# Patient Record
Sex: Female | Born: 1980
Health system: Southern US, Community
[De-identification: ages and names within clinical notes are randomized; demographics above are authoritative.]

## PROBLEM LIST (undated history)

## (undated) DIAGNOSIS — I10 Essential (primary) hypertension: Secondary | ICD-10-CM

## (undated) DIAGNOSIS — E119 Type 2 diabetes mellitus without complications: Secondary | ICD-10-CM

---
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
## 2003-02-09 ENCOUNTER — Ambulatory Visit (HOSPITAL_COMMUNITY): Admission: RE | Admit: 2003-02-09 | Discharge: 2003-02-09 | Payer: Self-pay | Admitting: *Deleted

## 2003-03-08 ENCOUNTER — Ambulatory Visit (HOSPITAL_COMMUNITY): Admission: RE | Admit: 2003-03-08 | Discharge: 2003-03-08 | Payer: Self-pay | Admitting: *Deleted

## 2003-05-17 ENCOUNTER — Ambulatory Visit (HOSPITAL_COMMUNITY): Admission: RE | Admit: 2003-05-17 | Discharge: 2003-05-17 | Payer: Self-pay | Admitting: *Deleted

## 2003-06-26 ENCOUNTER — Encounter (INDEPENDENT_AMBULATORY_CARE_PROVIDER_SITE_OTHER): Payer: Self-pay | Admitting: Specialist

## 2003-06-26 ENCOUNTER — Ambulatory Visit: Payer: Self-pay | Admitting: Obstetrics & Gynecology

## 2003-06-26 ENCOUNTER — Inpatient Hospital Stay (HOSPITAL_COMMUNITY): Admission: AD | Admit: 2003-06-26 | Discharge: 2003-06-29 | Payer: Self-pay | Admitting: Obstetrics and Gynecology

## 2003-07-04 ENCOUNTER — Inpatient Hospital Stay (HOSPITAL_COMMUNITY): Admission: RE | Admit: 2003-07-04 | Discharge: 2003-07-04 | Payer: Self-pay | Admitting: *Deleted

## 2006-02-23 IMAGING — US US OB FOLLOW-UP
1 series · 13 of 28 positions shown · non-contrast
Comparison: none

CLINICAL DATA: G2P1.  Assess growth.  EDC 07/29/03 by first ultrasound (29 weeks 4 days).

[Series 1: unknown · 0.30mm/px · 13 of 30 slices shown]
[im 2/30]
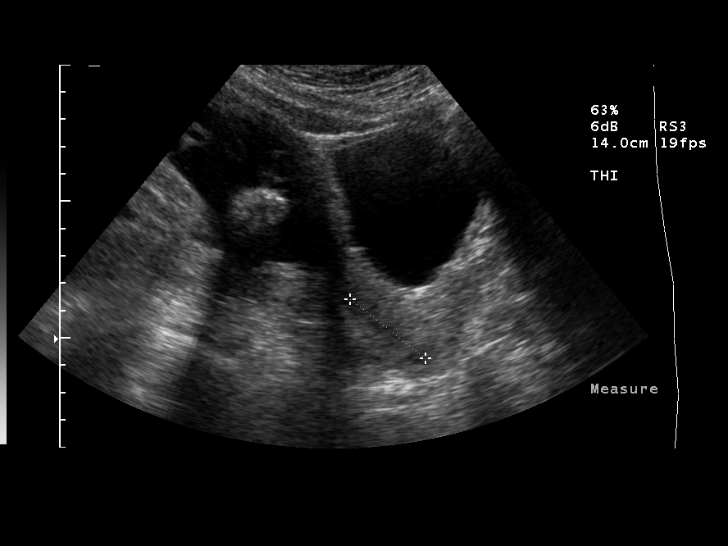
[im 4/30]
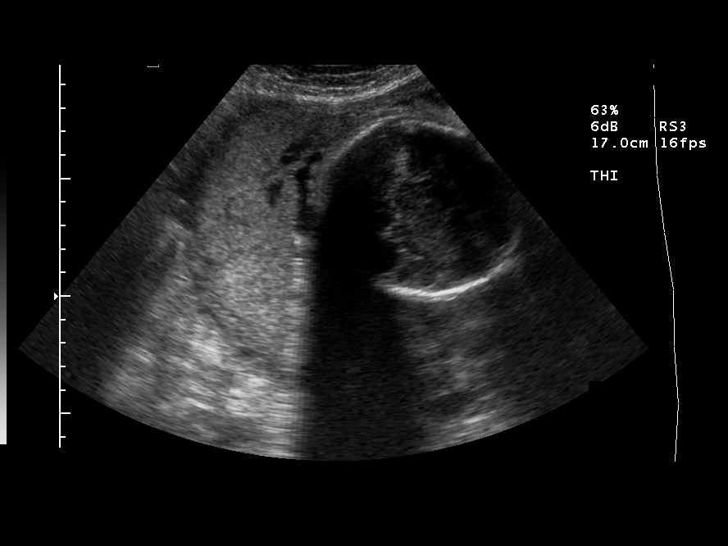
[im 6/30]
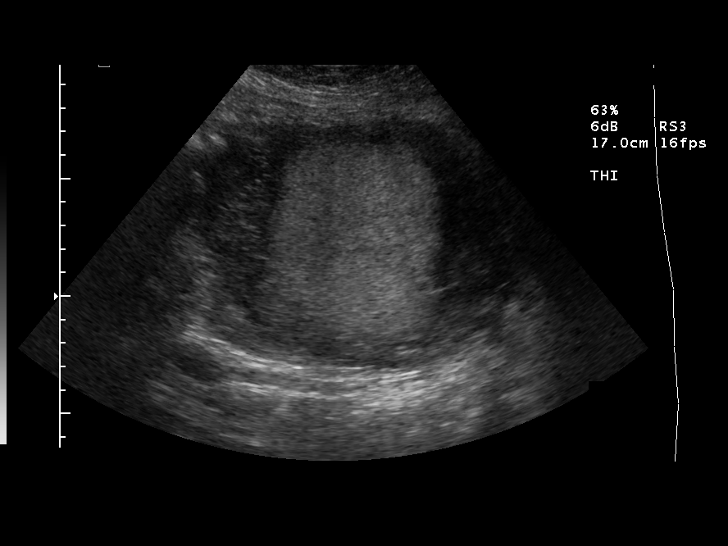
[im 8/30]
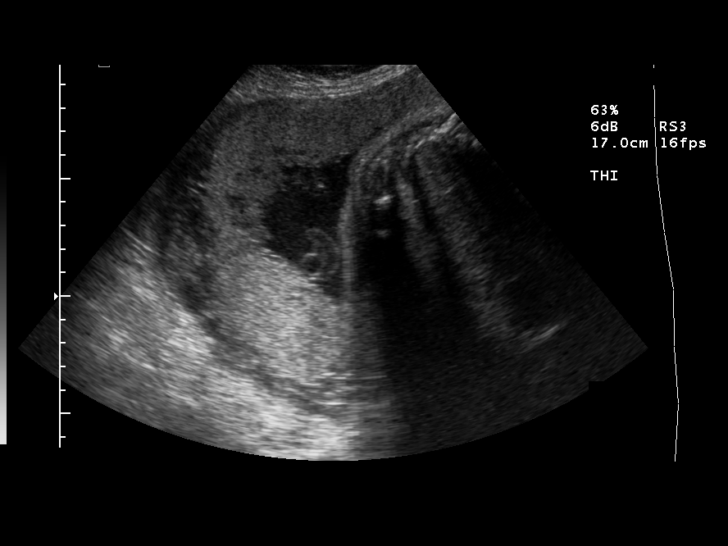
[im 10/30]
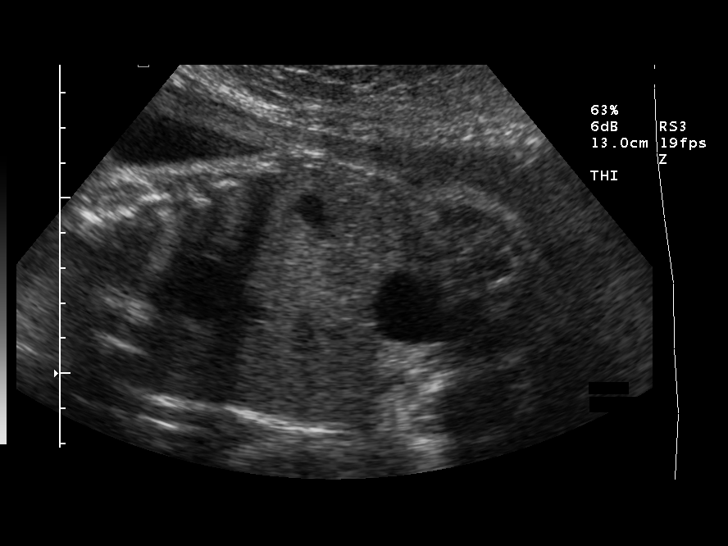
[im 12/30]
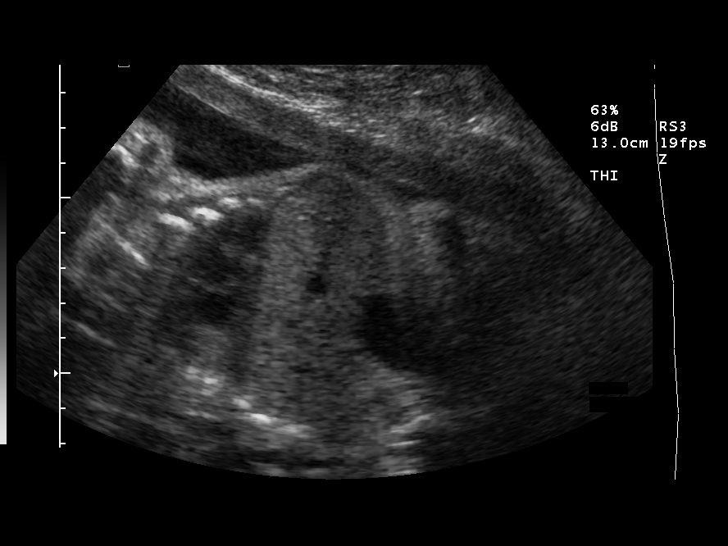
[im 16/30]
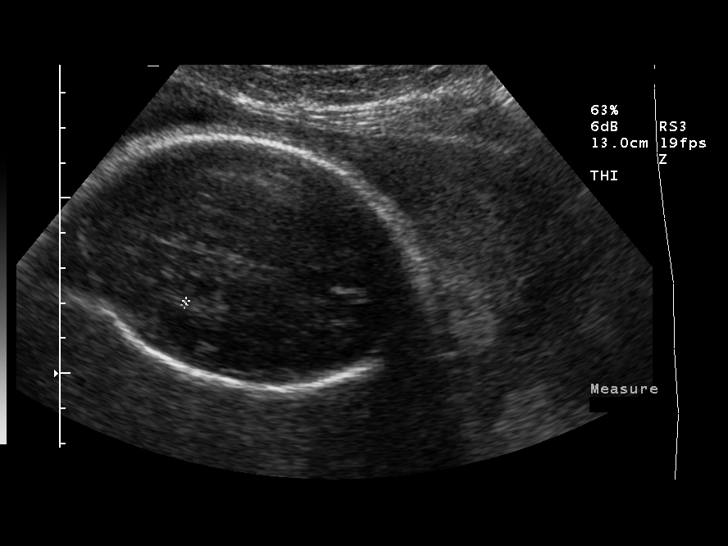
[im 18/30]
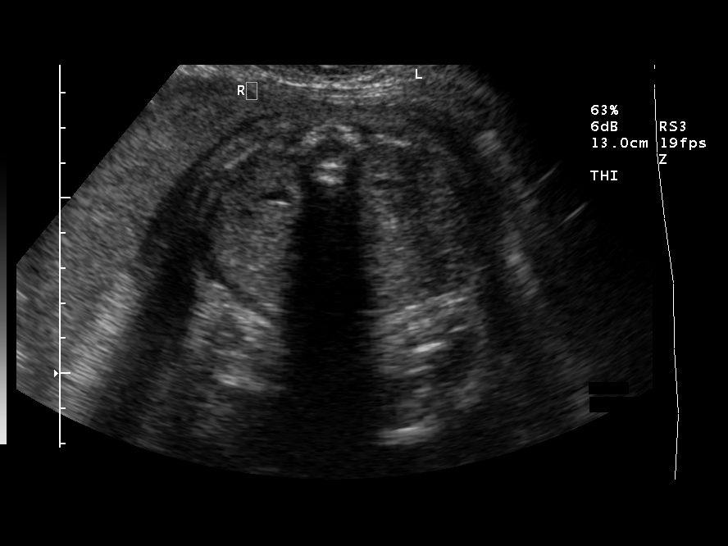
[im 20/30]
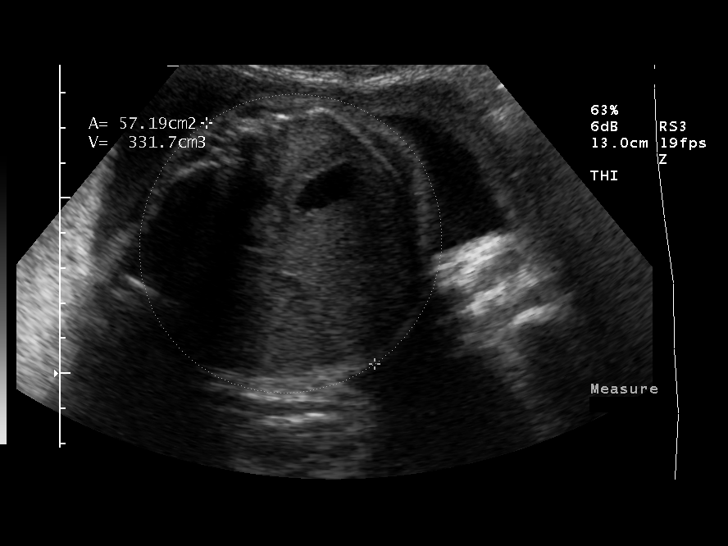
[im 22/30]
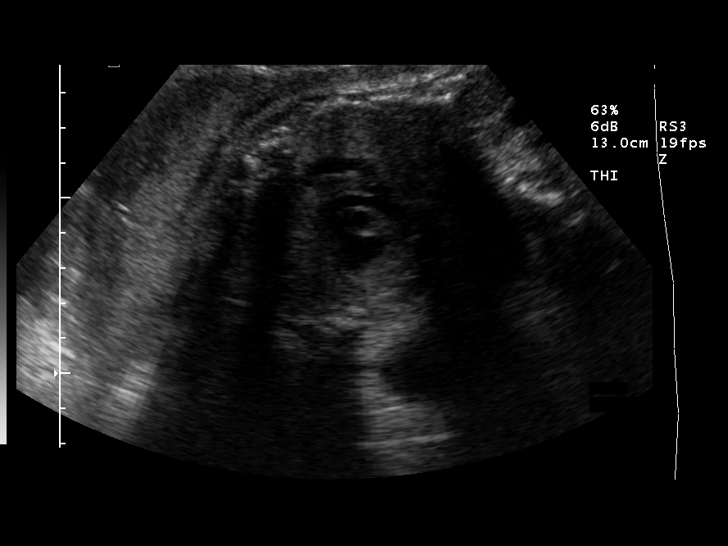
[im 24/30]
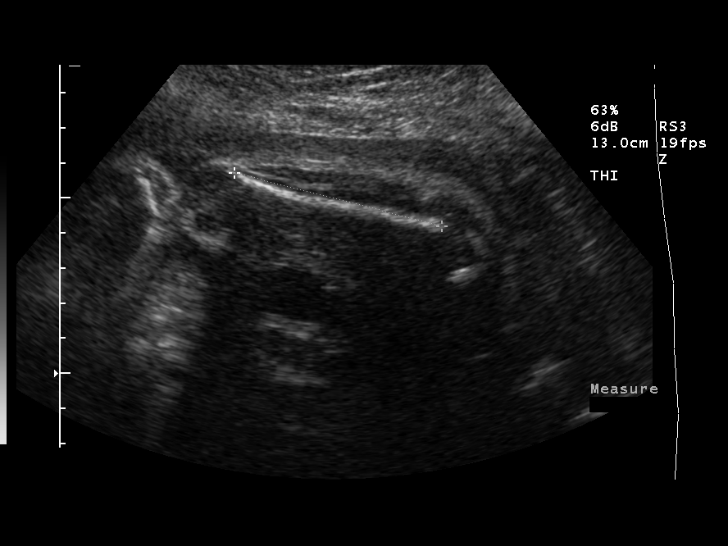
[im 26/30]
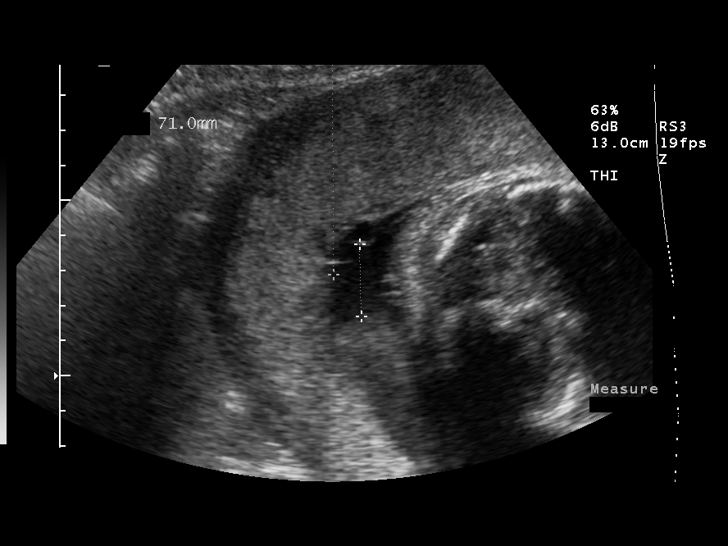
[im 28/30]
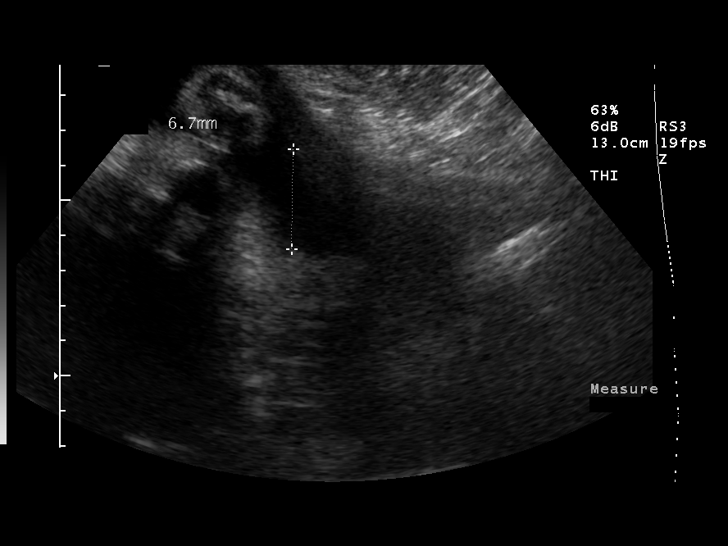

[13 of 28 positions shown; findings below may reference images not displayed]

OBSTETRICAL ULTRASOUND RE-EVALUATION
 Number of Fetuses: 1
 Heart Rate: 133 BPM
 Movement: Yes
 Breathing: No
 Presentation: Breech
 Placental Location: Fundal, posterior, right lateral
 Grade: I
 Previa: No
 Amniotic Fluid (subjective): Normal
 Amniotic Fluid (objective): 13.3 cm AFI (1th-11th %ile = 9.0-23.4 cm for 30 weeks)

 FETAL BIOMETRY
 BPD:  7.1 cm, 28W 3D
 HC: 27.0 cm, 29W 3D
 AC: 27.0 cm, 31W 1D
 FL: 6.1 cm, 31W 4D

 Mean GA: 30W 1D
 Assigned GA: 29W 4D

 EFW: 2526 g (H) 43th-11th %ile (1311-1862 g) for 30 weeks

 FETAL ANATOMY
 Lateral Ventricles: Visualized 
 Thalami/CSP: Visualized 
 Posterior Fossa: Previously seen 
 Nuchal Region: Previously seen 
 Spine: Previously seen 
 4 Chamber Heart on Left: Previously seen 
 Stomach on Left: Visualized 
 3 Vessel Cord: Previously seen 
 Cord Insertion Site: Previously seen 
 Kidneys: Visualized 
 Bladder: Visualized 
 Extremities: Previously seen 

 ADDITIONAL ANATOMY VISUALIZED: RVOT, diaphragm.

 Evaluation limited by: fetal positioning.

 MATERNAL FINDINGS
 Cervix: 3.5 transabdominally
IMPRESSION: Single living intrauterine fetus in breech presentation.  Patient’s assigned gestational age is 29 weeks 4 days by first ultrasound.  She measures 30 weeks 1 day today with a fetal weight of 2526 grams, falling between the 75th and 90th percentile for 30 weeks.  Growth is appropriate.
 Normal amniotic fluid volume with AFI 13.3. cm.

## 2007-01-20 ENCOUNTER — Emergency Department (HOSPITAL_COMMUNITY): Admission: EM | Admit: 2007-01-20 | Discharge: 2007-01-20 | Payer: Self-pay | Admitting: Emergency Medicine

## 2010-06-28 NOTE — Discharge Summary (Signed)
NAME:  Alyssa Price, Alyssa Price                    ACCOUNT NO.:  [id number]   MEDICAL RECORD NO.:  [id number]                   PATIENT TYPE:  INP   LOCATION:  9147                                 FACILITY:  [facility]   PHYSICIAN:  Phil D. Okey Dupre, M.D.                  DATE OF BIRTH:  1980-10-25   DATE OF ADMISSION:  06/26/2003  DATE OF DISCHARGE:                                 DISCHARGE SUMMARY   DISCHARGE DIAGNOSES:  1. Preterm delivery infant.  2. Maternal fever.   DISCHARGE MEDICATIONS:  1. Ibuprofen 600 mg one p.o. q.6h. p.r.n. pain.  2. Percocet one p.o. q.4-6h. p.r.n. pain.  3. Prenatal vitamins one p.o. daily x6 weeks.   DISCHARGE INSTRUCTIONS:  Wound care per instruction booklet.  Return to the MAU at [facility] Tuesday, Jul 04, 2003 for staple  removal.   DIET AT HOME:  Regular.   ACTIVITY:  Per booklet and no heavy lifting for 6 weeks.   SEXUAL ACTIVITY:  Per booklet and nothing in vagina for 6 weeks.   FOLLOW-UP:  Return to [facility] in 6 weeks.   SPECIAL INSTRUCTIONS:  Continue incentive spirometry.   DISCHARGE LABORATORY DATA:  Hemoglobin 8.9, hematocrit 27.7.  She had a  urinalysis that showed 15 for ketones but was otherwise within normal  limits.   PROCEDURES:  Low transverse cesarean section and bilateral tubal ligation.   HISTORY OF PRESENT ILLNESS:  This is a 30 year old G2 P1-0-0-1 now P1-1-0-2  admitted at 78 and 2 weeks with spontaneous rupture of membranes.  She  initially had a low-grade temperature and was treated with ampicillin and  gentamycin given her ruptured membranes.  She underwent a repeat low  transverse cesarean section after a failed VBAC.  She also underwent a BTL  at the time of her section.  She delivered a female infant with Apgars of 8  at one minute and 9 at five minutes.  She is bottle feeding.  She delivered  a three-vessel-cord placenta that was manually delivered and the placenta  was intact.   PRENATAL LABORATORY DATA:   The patient had B positive blood, antibody  negative.  Hemoglobin 12, platelets 305.  She was rubella immune.  Hepatitis  B surface antigen was negative.  RPR nonreactive.  HIV nonreactive.  GC and  chlamydia negative.  GBS negative.   HOSPITAL COURSE:  The patient labored for greater than 12 hours with  ruptured membranes and was unable to actively push the infant out.  Therefore she was taken to cesarean section with repeat low transverse  cesarean section and bilateral tubal ligation.  During her hospitalization  she was mildly febrile and underwent antibiotic treatment.  She continued to  have occasional low-grade temperatures, the highest of which was 99.9.  She  underwent testing for white blood count and urinalysis.  Her white count  came down from 13.5 to 11.1 and her urinalysis did not show  any signs of  infection.  It was felt that this was likely secondary to atelectasis as the  patient was clearly not taking deep breaths.  She was given incentive  spirometry and this should be continued as an outpatient.  The patient was  also tachycardic, also felt to be secondary to her atelectasis.  At  discharge her heart rate ranged between 100 and 110.  She was discharged to  home in improved condition with the previously-noted follow-up.     Ursula Beath, MD                     Phil D. Okey Dupre, M.D.    JT/MEDQ  D:  06/29/2003  T:  06/29/2003  Job:  [id number]

## 2010-06-28 NOTE — Op Note (Signed)
[facility]  Patient:    KADEISHA, BETSCH                   MRN: [id number] Proc. Date: 08/06/00 Adm. Date:  [id number] Attending:  Tammi Sou                           Operative Report  PREOPERATIVE DIAGNOSES:       1. Intrauterine pregnancy at 39 weeks.                               2. Gestational diabetes mellitus, diet                                  controlled.                               3. Meconium-stained fluid.                               4. Arrest of descent.                               5. Suspicious fetal heart tracing with                                  repetitive moderate to severe variable                                  decelerations.                               6. Persistent occiput posterior presentation.                               7. Chorioamnionitis.  POSTOPERATIVE DIAGNOSES:      1. Intrauterine pregnancy at 39 weeks.                               2. Gestational diabetes mellitus, diet                                  controlled.                               3. Meconium-stained fluid.                               4. Arrest of descent.                               5. Suspicious fetal heart tracing with  repetitive moderate to severe variable                                  decelerations.                               6. Persistent occiput posterior presentation.  OPERATION:                    Primary low transverse cesarean section via                               Pfannenstiel.  SURGEON:                      Roseanna Rainbow, M.D.  ASSISTANT:                    Gwenlyn Perking, M.D.  ANESTHESIA:                   Epidural.  COMPLICATIONS:                None.  ESTIMATED BLOOD LOSS:         800 cc.  FLUIDS AS PER ANESTHESIOLOGY: Urine output 100 cc blood-tinged urine noted both at the beginning and at the end of the procedure.  INDICATIONS:                   The patient is an 30 year old, G1, P0, at 39+ weeks who presented in active labor.  She progressed to fully dilated; however, the past was malpositioned with persistent occiput posterior.  There was failure to descend below the +1 to +2 stations.  FINDINGS:                     Female infant, cephalic presentation.  Thick meconium noted with meconium below the cord.  Neonatology present at delivery. Apgars 4/8, weight 6 pounds, 14 ounces.  Umbilical vein pH 7.33. Normal uterus, tubes, and ovaries.  DESCRIPTION OF PROCEDURE:     The patient was taken to the operating room where her epidural anesthetic was found to be adequate.  She was then prepped and draped in the usual sterile fashion in the dorsosupine position with a leftward tilt.  A Pfannenstiel skin incision was then made with the scalpel and carried through to the underlying layer of fascia.  The fascia was nicked in the midline and the incision extended laterally with Mayo scissors.  The superior aspect of the fascial incision was then grasped with Kocher clamps, elevated, and the underlying rectus muscles dissected off.  Attention was then turned to the inferior aspect of the incision which was manipulated in a similar fashion.  The rectus muscles were separated in the midline, and the parietal peritoneum identified, tented up, and entered sharply.  The peritoneal incision was then extended superiorly and then inferiorly with good visualization of the bladder.   The bladder blade was then inserted and the vesicle uterine peritoneum identified, grasped with pickups, and entered sharply with Metzenbaum scissors.  This incision was then extended laterally and the bladder flap created digitally.  The bladder blade was then reinserted and the lower uterine segment incised in transverse fashion with the scalpel. The uterine incision was then extended laterally with bandage  scissors.  The bladder blade was removed, and, with some  difficulty and with assistance with elevation of the head from the below, the infants head was delivered atraumatically.  The nose and mouth were suctioned with the DeLee suction. The cord clamped and cut.  The infant was then handed off to the awaiting neonatologist.  Cord gases were sent.  The placenta was then removed.  The uterus exteriorized and cleared of all clots of debris.  Upon inspection of the uterine incision, there were two small extensions involving the inferolateral margins of the incision bilaterally.  These were 1 to 2 cm in length.  These extensions were repaired separately with 0 Monocryl in a running locked fashion.  The remainder of the uterine incision was repaired in a similar fashion.  Excellent hemostasis was noted.  The uterus was returned to the abdomen.  The gutters were cleared of all clots.  The fascia was reapproximated with 0 Vicryl in a running fashion.  The skin was closed with staples. The patient tolerated the procedure well.  Sponge, lap, and needle counts were correct x 2.  The patient was taken to the PACU in stable condition. DD:  08/06/00 TD:  08/06/00 Job: [id number]

## 2010-06-28 NOTE — Op Note (Signed)
NAME:  Alyssa Price, Alyssa Price                    ACCOUNT NO.:  [id number]   MEDICAL RECORD NO.:  [id number]                   PATIENT TYPE:  INP   LOCATION:  9147                                 FACILITY:  [facility]   PHYSICIAN:  Lesly Dukes, M.D.              DATE OF BIRTH:  09/05/1980   DATE OF PROCEDURE:  06/27/2003  DATE OF DISCHARGE:                                 OPERATIVE REPORT   PREOPERATIVE DIAGNOSES:  30 year old, G2, para 1-0-0-1 at 35 weeks and 2  days estimated gestational age, failed VBAC and also multiparity and desires  sterility.   POSTOPERATIVE DIAGNOSES:  30 year old, G2, para 1-0-0-1 at 35 weeks and 2  days estimated gestational age, failed VBAC and also multiparity and desires  sterility.   PROCEDURE:  Repeat low flap transverse cesarean section and bilateral tubal  ligation; lysis of adhesions.   SURGEON:  Lesly Dukes, M.D.   ASSISTANT:  Randye Lobo, M.D.   ANESTHESIA:  Epidural.   ESTIMATED BLOOD LOSS:  700 mL.   COMPLICATIONS:  None.   PATHOLOGY:  Placenta.   FINDINGS:  Viable female infant with Apgar of 8 at 1 minute, 9 at 5 minutes  weighing 6 pounds, 8 ounces, vertex presentation with deflexed OP; arterial  pH equal 7.15; clear fluid, normal uterus, fallopian tubes and ovaries  bilaterally; omental adhesions, __________ delivery.   DESCRIPTION OF PROCEDURE:  After full consent was obtained, the patient was  taken to the operating room where epidural anesthesia was found to be  adequate. The patient was placed in dorsal lithotomy position with a  leftward tilt, prepped and draped in the normal sterile fashion.  A Foley  was then applied and IV was running.  A Pfannenstiel skin incision was made  with the scalpel and carried down to the underlying fascia, the fascia was  incised in the midline, incision was extended bilaterally. The superior and  inferior aspects of the fascial incision were grasped with Kocher clamps,  tented up, and  dissected off sharply and bluntly from underlying layers of  rectus muscles.  The rectus muscle was separated in the midline. The  peritoneum was entered sharply and this incision was extended both  superiorly and inferiorly with good visualization of the bladder.  The  bladder blade was inserted.  The vesicouterine peritoneum was identified,  tented up and entered sharply with Metzenbaum scissors. This incision was  extended bilaterally.  The bladder flap was created digitally.  The bladder  blade was reinserted.  The uterine incision was made in a transverse fashion  in the lower uterine segment.  This incision was extended bilaterally with  the banded scissors.  The baby's head delivered without incident, and the  nose and mouth were suctioned.  The baby's body delivered easily.  The cord  was clamped and cut, and the baby was handed off to the waiting  pediatrician. Cord blood was sent for type and screen  and cord gas was also  sent.  The placenta delivered spontaneously with three vessel cord. The  uterus was cleared of all clot and debris and exteriorized.  The uterine  incision was closed with #0 Vicryl in a running locked fashion. Good  hemostasis was noted. The gutters were cleared of all clot and debris.  The  fallopian tubes were then ligated using Filshie clips. Each fallopian tube  was tented up in the middle and clamped with the Filshie clip.  The uterus  was returned to the abdomen and noted to be hemostatic off tension.  The  Filshie clips were still in place. The fascia was then closed with #0 Vicryl  in a running fashion. Good hemostasis was noted.  The subcutaneous tissue  was copiously irrigated. The wound was also found to be hemostatic. The skin  was closed with staples. The patient tolerated the procedure well. Sponge,  lap, instrument and needle count were correct x2.  The patient went to the  recovery room in good condition.                                                Lesly Dukes, M.D.    Lora Paula  D:  06/27/2003  T:  06/27/2003  Job:  [id number]

## 2010-06-28 NOTE — Discharge Summary (Signed)
[facility]  Patient:    Alyssa Price, Alyssa Price                   MRN: [id number] Adm. Date:  [id number] Disc. Date: [id number] Attending:  Tammi Sou Dictator:   Santiago Bumpers, M.D.                           Discharge Summary  ADMISSION DIAGNOSES:          1. Term intrauterine pregnancy in active labor.                               2. Diet controlled gestational diabetes                                  mellitus.  DISCHARGE DIAGNOSES:          1. Term intrauterine pregnancy in active labor.                               2. Diet controlled gestational diabetes                                  mellitus.                               3. Status post cesarean delivery of a viable                                  female infant.                               4. Meconium stained fluid.  DISCHARGE MEDICATIONS:        1. Percocet one tab q.4-6h. p.r.n. for pain.                               2. Ibuprofen one tab q.6h. p.r.n. for pain.                               3. Prenatal vitamins one tab q.d. for six weeks.  RESIDENT:                     Gwenlyn Perking, M.D.  CONSULTS:                     None.  PROCEDURE:                    Low transverse cesarean section on August 06, 2000.  INDICATIONS:                  Arrest of descent, meconium stained fluid, persistent OP, suspicious fetal heart tracing.  FINDINGS:                     Viable female infant.  Estimated blood loss 800 cc.  No complications.  HISTORY AND  PHYSICAL:         For complete H&P please see resident H&P in chart.  Briefly, this was an 30 year old G1, P0 who presented at [redacted] weeks gestation in early active labor with cervical examination of 5 cm, 90% effaced, -1 station with vertex presentation.  She was afebrile and the remainder of physical examination was within normal limits.  Routine prenatal laboratories were unremarkable.  The patient did carry a diagnosis of gestational  diabetes mellitus which was diet controlled.  She was admitted to labor and delivery for expectant management of labor.  HOSPITAL COURSE:              Patient was admitted to labor and delivery unit and progressed in active phase of labor.  Her pushing was difficult and did not result in descent of the fetus.  Position was determined to be direct OP. Fetus did have severe variable decelerations with expulsive efforts.  Due to this nonreassuring fetal heart tracing, meconium stained fluid, and arrest of descent the patient was taken for low transverse cesarean section and findings were as dictated in the procedure portion of this dictation.  Postoperative course was complicated by a low grade fever of 100.8 postoperative day #1.  Patient was continued on Unasyn.  Physical examination did not reveal any focus of infection.  She had no further fever and physical examination remained unremarkable with incision being clean, dry, and intact and abdomen was soft and appropriately tender.  The postoperative hemoglobin was 9.9.  On postoperative day #3 patient was afebrile and was determined to be recovery well and was discharged home.  [facility]. DD:  08/27/00 TD:  08/28/00 Job: [id number]

## 2013-07-12 ENCOUNTER — Ambulatory Visit: Payer: Self-pay | Admitting: Internal Medicine

## 2013-12-26 ENCOUNTER — Encounter: Payer: Self-pay | Admitting: Internal Medicine

## 2013-12-26 ENCOUNTER — Ambulatory Visit (HOSPITAL_BASED_OUTPATIENT_CLINIC_OR_DEPARTMENT_OTHER): Payer: Medicaid Other

## 2013-12-26 ENCOUNTER — Ambulatory Visit: Payer: Medicaid Other | Attending: Internal Medicine | Admitting: Internal Medicine

## 2013-12-26 VITALS — BP 113/72 | HR 99 | Temp 98.7°F | Resp 16 | Wt 123.6 lb

## 2013-12-26 DIAGNOSIS — Z0000 Encounter for general adult medical examination without abnormal findings: Secondary | ICD-10-CM

## 2013-12-26 DIAGNOSIS — J3089 Other allergic rhinitis: Secondary | ICD-10-CM | POA: Diagnosis not present

## 2013-12-26 DIAGNOSIS — R05 Cough: Secondary | ICD-10-CM | POA: Insufficient documentation

## 2013-12-26 DIAGNOSIS — Z23 Encounter for immunization: Secondary | ICD-10-CM | POA: Diagnosis not present

## 2013-12-26 DIAGNOSIS — K219 Gastro-esophageal reflux disease without esophagitis: Secondary | ICD-10-CM | POA: Insufficient documentation

## 2013-12-26 DIAGNOSIS — Z9109 Other allergy status, other than to drugs and biological substances: Secondary | ICD-10-CM | POA: Insufficient documentation

## 2013-12-26 DIAGNOSIS — R0981 Nasal congestion: Secondary | ICD-10-CM | POA: Diagnosis not present

## 2013-12-26 DIAGNOSIS — R5383 Other fatigue: Secondary | ICD-10-CM | POA: Insufficient documentation

## 2013-12-26 DIAGNOSIS — R053 Chronic cough: Secondary | ICD-10-CM

## 2013-12-26 DIAGNOSIS — Z139 Encounter for screening, unspecified: Secondary | ICD-10-CM

## 2013-12-26 DIAGNOSIS — Z91048 Other nonmedicinal substance allergy status: Secondary | ICD-10-CM

## 2013-12-26 LAB — CBC WITH DIFFERENTIAL/PLATELET
BASOS ABS: 0 10*3/uL (ref 0.0–0.1)
Basophils Relative: 0 % (ref 0–1)
EOS PCT: 1 % (ref 0–5)
Eosinophils Absolute: 0.1 10*3/uL (ref 0.0–0.7)
HCT: 37.8 % (ref 36.0–46.0)
HEMOGLOBIN: 12.4 g/dL (ref 12.0–15.0)
LYMPHS ABS: 1.4 10*3/uL (ref 0.7–4.0)
Lymphocytes Relative: 19 % (ref 12–46)
MCH: 28.6 pg (ref 26.0–34.0)
MCHC: 32.8 g/dL (ref 30.0–36.0)
MCV: 87.3 fL (ref 78.0–100.0)
MPV: 9.6 fL (ref 9.4–12.4)
Monocytes Absolute: 0.8 10*3/uL (ref 0.1–1.0)
Monocytes Relative: 11 % (ref 3–12)
NEUTROS PCT: 69 % (ref 43–77)
Neutro Abs: 5 10*3/uL (ref 1.7–7.7)
PLATELETS: 257 10*3/uL (ref 150–400)
RBC: 4.33 MIL/uL (ref 3.87–5.11)
RDW: 13.9 % (ref 11.5–15.5)
WBC: 7.2 10*3/uL (ref 4.0–10.5)

## 2013-12-26 LAB — COMPLETE METABOLIC PANEL WITH GFR
ALT: 10 U/L (ref 0–35)
AST: 19 U/L (ref 0–37)
Albumin: 4.1 g/dL (ref 3.5–5.2)
Alkaline Phosphatase: 50 U/L (ref 39–117)
BUN: 9 mg/dL (ref 6–23)
CALCIUM: 9.5 mg/dL (ref 8.4–10.5)
CO2: 21 meq/L (ref 19–32)
Chloride: 104 mEq/L (ref 96–112)
Creat: 0.7 mg/dL (ref 0.50–1.10)
GFR, Est African American: >89 mL/min
GFR, Est Non African American: >89 mL/min
GLUCOSE: 75 mg/dL (ref 70–99)
POTASSIUM: 4.3 meq/L (ref 3.5–5.3)
Sodium: 136 mEq/L (ref 135–145)
TOTAL PROTEIN: 7.5 g/dL (ref 6.0–8.3)
Total Bilirubin: 1.4 mg/dL — ABNORMAL HIGH (ref 0.2–1.2)

## 2013-12-26 LAB — POCT GLYCOSYLATED HEMOGLOBIN (HGB A1C): HEMOGLOBIN A1C: 4.7

## 2013-12-26 LAB — TSH: TSH: 0.303 u[IU]/mL — AB (ref 0.350–4.500)

## 2013-12-26 LAB — GLUCOSE, POCT (MANUAL RESULT ENTRY): POC GLUCOSE: 72 mg/dL (ref 70–99)

## 2013-12-26 MED ORDER — OMEPRAZOLE 20 MG PO CPDR: 20.0000 mg | DELAYED_RELEASE_CAPSULE | Freq: Every day | ORAL | Status: DC

## 2013-12-26 MED ORDER — FLUTICASONE PROPIONATE 50 MCG/ACT NA SUSP: 2.0000 | Freq: Every day | NASAL | Status: AC

## 2013-12-26 MED ORDER — FLUTICASONE PROPIONATE 50 MCG/ACT NA SUSP: 2.0000 | Freq: Every day | NASAL | Status: DC

## 2013-12-26 NOTE — Progress Notes (Signed)
Patient Demographics  Alyssa Price, is a 33 y.o. female  WUJ:[id number]:[id number]  NFA:[id number]:[id number]  DOB - 1980-03-21  CC:  Chief Complaint  Patient presents with  . Establish Care       HPI: Alyssa Price is a 33 y.o. female here today to establish medical care.patient complains of lot of environmental allergies stuffy nose nonproductive cough, as per patient she has been taking over-the-counter antiallergy medication, as per patient she has been having cough which is for the last 3-4 months, the cough is worse at night and does complain of some reflux symptoms. Patient is complaining of fatigue, she denies any change in her menstrual periods, denies any change in bowel habits. Patient denies any fever chills. Patient has No headache, No chest pain, No abdominal pain - No Nausea, No new weakness tingling or numbness, No Cough - SOB.  No Known Allergies History reviewed. No pertinent past medical history. No current outpatient prescriptions on file prior to visit.   No current facility-administered medications on file prior to visit.   Family History  Problem Relation Age of Onset  . Hyperlipidemia Mother    History   Social History  . Marital Status: Single    Spouse Name: N/A    Number of Children: N/A  . Years of Education: N/A   Occupational History  . Not on file.   Social History Main Topics  . Smoking status: Never Smoker   . Smokeless tobacco: Not on file  . Alcohol Use: No  . Drug Use: Not on file  . Sexual Activity: Not on file   Other Topics Concern  . Not on file   Social History Narrative  . No narrative on file    Review of Systems: Constitutional: Negative for fever, chills, diaphoresis, activity change, appetite change and fatigue. HENT: Negative for ear pain, nosebleeds, congestion, facial swelling, rhinorrhea, neck pain, neck stiffness and ear discharge.  Eyes: Negative for pain, discharge, redness, itching and visual  disturbance. Respiratory: Negative for cough, choking, chest tightness, shortness of breath, wheezing and stridor.  Cardiovascular: Negative for chest pain, palpitations and leg swelling. Gastrointestinal: Negative for abdominal distention. Genitourinary: Negative for dysuria, urgency, frequency, hematuria, flank pain, decreased urine volume, difficulty urinating and dyspareunia.  Musculoskeletal: Negative for back pain, joint swelling, arthralgia and gait problem. Neurological: Negative for dizziness, tremors, seizures, syncope, facial asymmetry, speech difficulty, weakness, light-headedness, numbness and headaches.  Hematological: Negative for adenopathy. Does not bruise/bleed easily. Psychiatric/Behavioral: Negative for hallucinations, behavioral problems, confusion, dysphoric mood, decreased concentration and agitation.    Objective:   Filed Vitals:   12/26/13 1013  BP: 113/72  Pulse: 99  Temp: 98.7 F (37.1 C)  Resp: 16    Physical Exam: Constitutional: Patient appears well-developed she is coughing intermittently. HENT: Normocephalic, atraumatic, External right and left ear normal. Nasal congestion ? Nasal polyps. No sinus tenderness  Eyes: Conjunctivae and EOM are normal. PERRLA, no scleral icterus. Neck: Normal ROM. Neck supple. No JVD. No tracheal deviation. No thyromegaly. CVS: RRR, S1/S2 +, no murmurs, no gallops, no carotid bruit.  Pulmonary: Effort and breath sounds normal, no stridor, rhonchi, wheezes, rales.  Abdominal: Soft. BS +, no distension, tenderness, rebound or guarding.  Musculoskeletal: Normal range of motion. No edema and no tenderness.  Neuro: Alert. Normal reflexes, muscle tone coordination. No cranial nerve deficit. Skin: Skin is warm and dry. No rash noted. Not diaphoretic. No erythema. No pallor. Psychiatric: Normal mood and affect. Behavior, judgment, thought content normal.  No  results found for: WBC, HGB, HCT, MCV, PLT No results found for:  CREATININE, BUN, NA, K, CL, CO2  Lab Results  Component Value Date   HGBA1C 4.7 12/26/2013   Lipid Panel  No results found for: CHOL, TRIG, HDL, CHOLHDL, VLDL, LDLCALC     Assessment and plan:   1. Preventative health care Results for orders placed or performed in visit on 12/26/13  Glucose (CBG)  Result Value Ref Range   POC Glucose 72 70 - 99 mg/dl  HgB [id number]  Result Value Ref Range   Hemoglobin A1C 4.7      2. Needs flu shot Flu shot given today.  3. Other fatigue Will check baseline blood work.  - CBC with Differential - COMPLETE METABOLIC PANEL WITH GFR - TSH - Vit D  25 hydroxy (rtn osteoporosis monitoring)  4. Chronic cough  - DG Chest 2 View; Future  5. Environmental allergies OTC Zyrtec/Claritin when necessary  6. Nasal congestion  - fluticasone (FLONASE) 50 MCG/ACT nasal spray; Place 2 sprays into both nostrils daily.  Dispense: 16 g; Refill: 2  7. Gastroesophageal reflux disease, esophagitis presence not specified Lifestyle modification, trial of Prilosec  - omeprazole (PRILOSEC) 20 MG capsule; Take 1 capsule (20 mg total) by mouth daily.  Dispense: 30 capsule; Refill: 3      Health Maintenance  -Pap Smear: referred to GYN  -Vaccinations: Flu shot today   Return in about 3 months (around 03/28/2014).   Doris CheadleADVANI, Carleta Woodrow, MD

## 2013-12-26 NOTE — Progress Notes (Signed)
Patient here to establish care Patient complains of having a cough that started this past summer Complains of being tired all the time and cant focus Patient did state both of her children are autistic and feels that she might be as well

## 2013-12-27 ENCOUNTER — Other Ambulatory Visit: Payer: Self-pay | Admitting: Internal Medicine

## 2013-12-27 DIAGNOSIS — R7989 Other specified abnormal findings of blood chemistry: Secondary | ICD-10-CM

## 2013-12-27 LAB — VITAMIN D 25 HYDROXY (VIT D DEFICIENCY, FRACTURES): Vit D, 25-Hydroxy: 16 ng/mL — ABNORMAL LOW (ref 30–100)

## 2014-01-03 ENCOUNTER — Telehealth: Payer: Self-pay | Admitting: Emergency Medicine

## 2014-01-03 DIAGNOSIS — R7989 Other specified abnormal findings of blood chemistry: Secondary | ICD-10-CM

## 2014-01-03 MED ORDER — VITAMIN D (ERGOCALCIFEROL) 1.25 MG (50000 UNIT) PO CAPS: 50000.0000 [IU] | ORAL_CAPSULE | ORAL | Status: DC

## 2014-01-03 NOTE — Telephone Encounter (Signed)
-----   Message from Doris Cheadleeepak Advani, MD sent at 12/27/2013  1:29 PM EST ----- Blood work reviewed, noticed low vitamin D, call patient advise to start ergocalciferol 50,000 units once a week for the duration of  12 weeks. Also noticed abnormal TSH level, I have ordered full TFT panel, advise patient to do the blood work next week.

## 2014-01-13 ENCOUNTER — Ambulatory Visit: Payer: Medicaid Other | Attending: Internal Medicine

## 2014-01-13 DIAGNOSIS — R7989 Other specified abnormal findings of blood chemistry: Secondary | ICD-10-CM

## 2014-01-14 LAB — THYROID PANEL WITH TSH
FREE THYROXINE INDEX: 2.2 (ref 1.4–3.8)
T3 Uptake: 28 % (ref 22–35)
T4 TOTAL: 7.9 ug/dL (ref 4.5–12.0)
TSH: 0.757 u[IU]/mL (ref 0.350–4.500)

## 2014-01-14 LAB — T3, FREE: T3, Free: 3.1 pg/mL (ref 2.3–4.2)

## 2014-01-14 LAB — T4, FREE: Free T4: 1.17 ng/dL (ref 0.80–1.80)

## 2014-01-18 LAB — THYROID STIMULATING IMMUNOGLOBULIN: TSI: 31 % baseline (ref <140)

## 2014-01-20 ENCOUNTER — Telehealth: Payer: Self-pay

## 2014-01-20 NOTE — Telephone Encounter (Signed)
Patient is aware of her lab results 

## 2014-01-20 NOTE — Telephone Encounter (Signed)
-----   Message from Doris Cheadleeepak Advani, MD sent at 01/19/2014 11:34 AM EST ----- Call and let the Patient know that blood work is normal.

## 2017-10-30 ENCOUNTER — Emergency Department (HOSPITAL_COMMUNITY)
Admission: EM | Admit: 2017-10-30 | Discharge: 2017-10-30 | Disposition: A | Discharge status: Home / Self Care | Payer: Medicaid Other | Attending: Emergency Medicine | Admitting: Emergency Medicine

## 2017-10-30 ENCOUNTER — Other Ambulatory Visit: Payer: Self-pay

## 2017-10-30 ENCOUNTER — Encounter (HOSPITAL_COMMUNITY): Payer: Self-pay

## 2017-10-30 ENCOUNTER — Emergency Department (HOSPITAL_COMMUNITY): Payer: Medicaid Other

## 2017-10-30 DIAGNOSIS — Y939 Activity, unspecified: Secondary | ICD-10-CM | POA: Diagnosis not present

## 2017-10-30 DIAGNOSIS — I10 Essential (primary) hypertension: Secondary | ICD-10-CM | POA: Diagnosis not present

## 2017-10-30 DIAGNOSIS — E119 Type 2 diabetes mellitus without complications: Secondary | ICD-10-CM | POA: Diagnosis not present

## 2017-10-30 DIAGNOSIS — Y999 Unspecified external cause status: Secondary | ICD-10-CM | POA: Diagnosis not present

## 2017-10-30 DIAGNOSIS — Y929 Unspecified place or not applicable: Secondary | ICD-10-CM | POA: Insufficient documentation

## 2017-10-30 DIAGNOSIS — F329 Major depressive disorder, single episode, unspecified: Secondary | ICD-10-CM | POA: Insufficient documentation

## 2017-10-30 DIAGNOSIS — Z79899 Other long term (current) drug therapy: Secondary | ICD-10-CM | POA: Diagnosis not present

## 2017-10-30 DIAGNOSIS — T148XXA Other injury of unspecified body region, initial encounter: Secondary | ICD-10-CM

## 2017-10-30 DIAGNOSIS — S61032A Puncture wound without foreign body of left thumb without damage to nail, initial encounter: Secondary | ICD-10-CM | POA: Diagnosis present

## 2017-10-30 DIAGNOSIS — W5381XA Bitten by other rodent, initial encounter: Secondary | ICD-10-CM | POA: Insufficient documentation

## 2017-10-30 DIAGNOSIS — F32A Depression, unspecified: Secondary | ICD-10-CM

## 2017-10-30 DIAGNOSIS — Z23 Encounter for immunization: Secondary | ICD-10-CM | POA: Diagnosis not present

## 2017-10-30 HISTORY — DX: Essential (primary) hypertension: I10

## 2017-10-30 HISTORY — DX: Type 2 diabetes mellitus without complications: E11.9

## 2017-10-30 LAB — RAPID URINE DRUG SCREEN, HOSP PERFORMED
Amphetamines: NOT DETECTED
BARBITURATES: NOT DETECTED
BENZODIAZEPINES: NOT DETECTED
Cocaine: NOT DETECTED
Opiates: NOT DETECTED
TETRAHYDROCANNABINOL: NOT DETECTED

## 2017-10-30 LAB — COMPREHENSIVE METABOLIC PANEL
ALT: 14 U/L (ref 0–44)
AST: 22 U/L (ref 15–41)
Albumin: 4.4 g/dL (ref 3.5–5.0)
Alkaline Phosphatase: 53 U/L (ref 38–126)
Anion gap: 8 (ref 5–15)
BUN: 10 mg/dL (ref 6–20)
CO2: 24 mmol/L (ref 22–32)
Calcium: 9.5 mg/dL (ref 8.9–10.3)
Chloride: 109 mmol/L (ref 98–111)
Creatinine, Ser: 0.68 mg/dL (ref 0.44–1.00)
GFR calc Af Amer: >60 mL/min (ref >60)
GFR calc non Af Amer: >60 mL/min (ref >60)
Glucose, Bld: 81 mg/dL (ref 70–99)
POTASSIUM: 3.8 mmol/L (ref 3.5–5.1)
SODIUM: 141 mmol/L (ref 135–145)
Total Bilirubin: 1.3 mg/dL — ABNORMAL HIGH (ref 0.3–1.2)
Total Protein: 8.5 g/dL — ABNORMAL HIGH (ref 6.5–8.1)

## 2017-10-30 LAB — CBC WITH DIFFERENTIAL/PLATELET
Basophils Absolute: 0 K/uL (ref 0.0–0.1)
Basophils Relative: 0 %
Eosinophils Absolute: 0 K/uL (ref 0.0–0.7)
Eosinophils Relative: 0 %
HCT: 41.2 % (ref 36.0–46.0)
Hemoglobin: 13.5 g/dL (ref 12.0–15.0)
Lymphocytes Relative: 30 %
Lymphs Abs: 1.6 K/uL (ref 0.7–4.0)
MCH: 29 pg (ref 26.0–34.0)
MCHC: 32.8 g/dL (ref 30.0–36.0)
MCV: 88.6 fL (ref 78.0–100.0)
Monocytes Absolute: 0.4 K/uL (ref 0.1–1.0)
Monocytes Relative: 7 %
Neutro Abs: 3.3 K/uL (ref 1.7–7.7)
Neutrophils Relative %: 63 %
Platelets: 372 K/uL (ref 150–400)
RBC: 4.65 MIL/uL (ref 3.87–5.11)
RDW: 13.6 % (ref 11.5–15.5)
WBC: 5.4 K/uL (ref 4.0–10.5)

## 2017-10-30 LAB — ETHANOL: Alcohol, Ethyl (B): <10 mg/dL

## 2017-10-30 LAB — TSH: TSH: 0.503 u[IU]/mL (ref 0.350–4.500)

## 2017-10-30 LAB — I-STAT BETA HCG BLOOD, ED (MC, WL, AP ONLY): I-stat hCG, quantitative: <5 m[IU]/mL (ref <5)

## 2017-10-30 MED ORDER — HYDROXYZINE HCL 25 MG PO TABS: 25.0000 mg | ORAL_TABLET | Freq: Three times a day (TID) | ORAL | Status: DC | PRN

## 2017-10-30 MED ORDER — AMOXICILLIN-POT CLAVULANATE 875-125 MG PO TABS: 1.0000 | ORAL_TABLET | Freq: Two times a day (BID) | ORAL | 0 refills | Status: AC

## 2017-10-30 MED ORDER — HYDROXYZINE HCL 25 MG PO TABS: 25.0000 mg | ORAL_TABLET | Freq: Three times a day (TID) | ORAL | 0 refills | Status: AC | PRN

## 2017-10-30 MED ORDER — TETANUS-DIPHTH-ACELL PERTUSSIS 5-2.5-18.5 LF-MCG/0.5 IM SUSP
0.5000 mL | Freq: Once | INTRAMUSCULAR | Status: AC
  Administered 2017-10-30: 0.5 mL via INTRAMUSCULAR
  Filled 2017-10-30: qty 0.5

## 2017-10-30 MED FILL — AMOX-CLAV 875-125 MG TABLET: 875-125 | 7 days supply | Qty: 14 | Fill #0

## 2017-10-30 NOTE — ED Notes (Signed)
Bed: WA06 Expected date:  Expected time:  Means of arrival:  Comments: Hold EMS thumb injury

## 2017-10-30 NOTE — ED Notes (Signed)
PT AWARE OF NEED FOR URINE SAMPLE. UNABLE TO GO AT THIS TIME. 

## 2017-10-30 NOTE — ED Provider Notes (Signed)
[facility]-EMERGENCY DEPT Provider Note   CSN: [id number] Arrival date & time: 10/30/17  0848     History   Chief Complaint Chief Complaint  Patient presents with  . Animal Bite  . Depression    HPI TAMIRA RYLAND is a 37 y.o. female.  Patient is a 37 year old female with a history of hypertension and diabetes presenting today after being bit by a chipmunk.  Patient states they had put out a glue strip to catch a snake and instead a chipmunk got stuck on it.  Her daughter was trying to get the chipmunk off and she went over trying to free it.  It bit her on the left thumb but then died shortly after.  Patient is having pain and bleeding to the left thumb and nail area.  She is not sure when her last tetanus shot was.  Pain is 2 out of 10 and mild in nature.  Secondly patient also states she has been feeling depressed.  She states for years now she has had difficulty even willing herself to get out of bed but is only worsened.  She states she feels angry a lot, only wants to sleep and just does not have any energy or will to do anything.  She takes no medications for this and is never been diagnosed with depression.  She does think about what good is her life if she does not even have the will to get out of bed but does not have any suicidal thoughts or plans.  She denies any homicidal thoughts.  She does not use substances.  The history is provided by the patient.  Animal Bite  Contact animal:  Rodent (chipmunk) Location:  Finger Finger injury location:  L thumb Time since incident:  1 hour Pain details:    Quality:  Localized and sharp   Severity:  Mild   Timing:  Constant   Progression:  Unchanged Incident location:  Home Provoked: provoked   Notifications:  Animal control Animal's rabies vaccination status:  Never received Animal in possession: yes   Tetanus status:  Out of date Relieved by:  Nothing Worsened by:  Nothing Associated symptoms:  no numbness   Depression     Past Medical History:  Diagnosis Date  . Diabetes mellitus without complication (HCC)   . Hypertension     Patient Active Problem List   Diagnosis Date Noted  . Other fatigue 12/26/2013  . Environmental allergies 12/26/2013  . Nasal congestion 12/26/2013  . Esophageal reflux 12/26/2013    Past Surgical History:  Procedure Laterality Date  . CESAREAN SECTION       OB History   None      Home Medications    Prior to Admission medications   Medication Sig Start Date End Date Taking? Authorizing Provider  fluticasone (FLONASE) 50 MCG/ACT nasal spray Place 2 sprays into both nostrils daily. Patient taking differently: Place 2 sprays into both nostrils daily as needed for allergies.  12/26/13  Yes Advani, Ayesha Rumpf, MD  Multiple Vitamin (MULTIVITAMIN WITH MINERALS) TABS tablet Take 1 tablet by mouth daily.   Yes [provider]  omeprazole (PRILOSEC) 20 MG capsule Take 1 capsule (20 mg total) by mouth daily. Patient not taking: Reported on 10/30/2017 12/26/13   Doris Cheadle, MD  Vitamin D, Ergocalciferol, (DRISDOL) 50000 UNITS CAPS capsule Take 1 capsule (50,000 Units total) by mouth every 7 (seven) days. Patient not taking: Reported on 10/30/2017 01/03/14   Doris Cheadle, MD  Family History Family History  Problem Relation Age of Onset  . Hyperlipidemia Mother     Social History Social History   Tobacco Use  . Smoking status: Never Smoker  . Smokeless tobacco: Never Used  Substance Use Topics  . Alcohol use: No    Alcohol/week: 0.0 standard drinks  . Drug use: Not on file     Allergies   Patient has no known allergies.   Review of Systems Review of Systems  Neurological: Negative for numbness.  Psychiatric/Behavioral: Positive for depression.  All other systems reviewed and are negative.    Physical Exam Updated Vital Signs BP (!) 128/95 (BP Location: Right Arm)   Pulse 91   Temp 98.2 F (36.8 C) (Oral)    Resp 16   Ht 5\' 6"  (1.676 m)   Wt 74.8 kg   LMP 10/11/2017   SpO2 100%   BMI 26.63 kg/m   Physical Exam  Constitutional: She is oriented to person, place, and time. She appears well-developed and well-nourished. No distress.  HENT:  Head: Normocephalic and atraumatic.  Mouth/Throat: Oropharynx is clear and moist.  Eyes: Pupils are equal, round, and reactive to light. Conjunctivae and EOM are normal.  Neck: Normal range of motion. Neck supple.  Cardiovascular: Normal rate, regular rhythm and intact distal pulses.  No murmur heard. Pulmonary/Chest: Effort normal and breath sounds normal. No respiratory distress. She has no wheezes. She has no rales.  Abdominal: Soft. She exhibits no distension. There is no tenderness. There is no rebound and no guarding.  Musculoskeletal: Normal range of motion. She exhibits no edema or tenderness.       Hands: Neurological: She is alert and oriented to person, place, and time.  Skin: Skin is warm and dry. No rash noted. No erythema.  Psychiatric: She has a normal mood and affect. Her behavior is normal.  Nursing note and vitals reviewed.    ED Treatments / Results  Labs (all labs ordered are listed, but only abnormal results are displayed) Labs Reviewed - No data to display  EKG None  Radiology Dg Finger Thumb Left  Result Date: 10/30/2017 CLINICAL DATA:  Bit by chip months. EXAM: LEFT THUMB 2+V COMPARISON:  None. FINDINGS: There is no evidence of fracture or dislocation. There is no evidence of arthropathy or other focal bone abnormality. Soft tissues are unremarkable. No soft tissue gas or radiopaque foreign body. IMPRESSION: Negative. Electronically Signed   By: Charlett NoseKevin  Dover M.D.   On: 10/30/2017 10:23    Procedures Procedures (including critical care time)  Medications Ordered in ED Medications  Tdap (BOOSTRIX) injection 0.5 mL (0.5 mLs Intramuscular Given 10/30/17 1005)     Initial Impression / Assessment and Plan / ED Course  I  have reviewed the triage vital signs and the nursing notes.  Pertinent labs & imaging results that were available during my care of the patient were reviewed by me and considered in my medical decision making (see chart for details).     Patient presenting today with a remote bite to her thumb when she was trying to get it out of her trap.  No concern for rabies at this time as these animals do not typically carry rabies and it was a provoked attack.  Patient's tetanus shot was updated.  X-ray was negative for any foreign bodies are fractures.  Will cover with Augmentin for typical pathogens.  Wound was cleaned and dressed. Secondly we will have TTS evaluate patient for her depression.  We will  also get baseline labs as patient in the past has been diagnosed with diabetes to ensure she is not significantly hyperglycemic and will also send a thyroid screening test.  2:37 PM Lab work is reassuring without acute findings.  Behavioral health evaluate the patient and feel that she would be a good patient for outpatient resources.  Patient will be discharged home.  Final Clinical Impressions(s) / ED Diagnoses   Final diagnoses:  Animal bite  Depression, unspecified depression type    ED Discharge Orders    None       Gwyneth Sprout, MD 10/30/17 1439

## 2017-10-30 NOTE — ED Notes (Signed)
ED Provider aware that pt requesting an update

## 2017-10-30 NOTE — BH Assessment (Signed)
Assessment Note  Alyssa Price is an 37 y.o. female that presents this date with anxiety and depression. Patient denies any S/I, H/I or AVH. Patient denies any prior MH diagnoses or inpatient/OP care. Patient did report she was diagnosed with depression by her OBGYN three years ago although felt her symptoms at that time did not require any medication interventions that were suggested. Patient reports increased depressive symptoms for the last six months that she feels is related to "family stress" and her son graduating from high school. Patient reports she has been only sleeping 3 hours or less night for the last week. Patient is oriented x 4 and denies any previous attempts/gestures at self harm. Patient denies any history of SA issues. Patient denies any current legal issues and does not appear to responding to any internal stimuli. Patient is alert pleasant and speaks in a low soft voice. Patient reports symptoms of depression to include: excessive fatigue and "feeling useless." Per notes, "Patient has  history of hypertension and diabetes presenting today after being bit by a chipmunk. Patient states they had put out a glue strip to catch a snake and instead a chipmunk got stuck on it. Her daughter was trying to get the chipmunk off and she went over trying to free it. It bit her on the left thumb but then died shortly after. Patient is having pain and bleeding to the left thumb and nail area. Secondly patient also states she has been feeling depressed. She states for years now she has had difficulty even willing herself to get out of bed but is only worsened. She states she feels angry a lot, only wants to sleep and just does not have any energy or will to do anything. She takes no medications for this and is never been diagnosed with depression. She does think about what good is her life if she does not even have the will to get out of bed but does not have any suicidal thoughts or plans. She denies  any homicidal thoughts. She does not use substances". Patient is requesting assistance with symptom management. Case was staffed with Arville Care FNP who evaluated patient for possible medication interventions and states patient does not meet inpatient criteria. Patient will be provided with OP resources on discharge.    Diagnosis: F33.2 MDD recurrent without psychotic features, severe GAD  Past Medical History:  Past Medical History:  Diagnosis Date  . Diabetes mellitus without complication (HCC)   . Hypertension     Past Surgical History:  Procedure Laterality Date  . CESAREAN SECTION      Family History:  Family History  Problem Relation Age of Onset  . Hyperlipidemia Mother     Social History:  reports that she has never smoked. She has never used smokeless tobacco. She reports that she does not drink alcohol. Her drug history is not on file.  Additional Social History:  Alcohol / Drug Use Pain Medications: See MAR Prescriptions: See MAR Over the Counter: See MAR History of alcohol / drug use?: No history of alcohol / drug abuse Longest period of sobriety (when/how long): NA Negative Consequences of Use: (Denies) Withdrawal Symptoms: (Denies)  CIWA: CIWA-Ar BP: 118/87 Pulse Rate: 89 COWS:    Allergies: No Known Allergies  Home Medications:  (Not in a hospital admission)  OB/GYN Status:  Patient's last menstrual period was 10/11/2017.  General Assessment Data Location of Assessment: [facility] ED TTS Assessment: In system Is this a Tele or Face-to-Face Assessment?: Face-to-Face Is  this an Initial Assessment or a Re-assessment for this encounter?: Initial Assessment Patient Accompanied by:: (NA) Language Other than English: No Living Arrangements: (NA) What gender do you identify as?: Female Marital status: Married [place] name: [name] Pregnancy Status: No Living Arrangements: Spouse/significant other Can pt return to current living arrangement?: Yes Admission Status:  Voluntary Is patient capable of signing voluntary admission?: Yes Referral Source: Self/Family/Friend Insurance type: Medicaid  Medical Screening Exam [facility] Walk-in ONLY) Medical Exam completed: Yes  Crisis Care Plan Living Arrangements: Spouse/significant other Legal Guardian: (NA) Name of Psychiatrist: None Name of Therapist: None  Education Status Is patient currently in school?: No Is the patient employed, unemployed or receiving disability?: Unemployed  Risk to self with the past 6 months Suicidal Ideation: No Has patient been a risk to self within the past 6 months prior to admission? : No Suicidal Intent: No Has patient had any suicidal intent within the past 6 months prior to admission? : No Is patient at risk for suicide?: No Suicidal Plan?: No Has patient had any suicidal plan within the past 6 months prior to admission? : No Access to Means: No What has been your use of drugs/alcohol within the last 12 months?: NA Previous Attempts/Gestures: No How many times?: 0 Other Self Harm Risks: (NA) Triggers for Past Attempts: Unknown Intentional Self Injurious Behavior: None Family Suicide History: No Recent stressful life event(s): (Family issues) Persecutory voices/beliefs?: No Depression: Yes Depression Symptoms: Feeling worthless/self pity Substance abuse history and/or treatment for substance abuse?: No Suicide prevention information given to non-admitted patients: Not applicable  Risk to Others within the past 6 months Homicidal Ideation: No Does patient have any lifetime risk of violence toward others beyond the six months prior to admission? : No Thoughts of Harm to Others: No Current Homicidal Intent: No Current Homicidal Plan: No Access to Homicidal Means: No Identified Victim: NA History of harm to others?: No Assessment of Violence: None Noted Violent Behavior Description: NA Does patient have access to weapons?: No Criminal Charges Pending?:  No Does patient have a court date: No Is patient on probation?: No  Psychosis Hallucinations: None noted Delusions: None noted  Mental Status Report Appearance/Hygiene: Unremarkable Eye Contact: Good Motor Activity: Freedom of movement Speech: Logical/coherent Level of Consciousness: Alert Mood: Anxious Affect: Appropriate to circumstance Anxiety Level: Moderate Thought Processes: Coherent, Relevant Judgement: Partial Orientation: Person, Place, Time Obsessive Compulsive Thoughts/Behaviors: None  Cognitive Functioning Concentration: Good Memory: Recent Intact, Remote Intact Is patient IDD: No Insight: Fair Impulse Control: Fair Appetite: Good Have you had any weight changes? : No Change Sleep: Decreased Total Hours of Sleep: 3 Vegetative Symptoms: None  ADLScreening [facility] Assessment Services) Patient's cognitive ability adequate to safely complete daily activities?: Yes Patient able to express need for assistance with ADLs?: Yes Independently performs ADLs?: Yes (appropriate for developmental age)  Prior Inpatient Therapy Prior Inpatient Therapy: No  Prior Outpatient Therapy Prior Outpatient Therapy: No Does patient have an ACCT team?: No Does patient have Intensive In-House Services?  : No Does patient have [facility] services? : No Does patient have P4CC services?: No  ADL Screening (condition at time of admission) Patient's cognitive ability adequate to safely complete daily activities?: Yes Is the patient deaf or have difficulty hearing?: No Does the patient have difficulty seeing, even when wearing glasses/contacts?: No Does the patient have difficulty concentrating, remembering, or making decisions?: No Patient able to express need for assistance with ADLs?: Yes Does the patient have difficulty dressing or bathing?: No Independently performs  ADLs?: Yes (appropriate for developmental age) Does the patient have difficulty walking or climbing stairs?:  No Weakness of Legs: None Weakness of Arms/Hands: None  Home Assistive Devices/Equipment Home Assistive Devices/Equipment: None  Therapy Consults (therapy consults require a physician order) PT Evaluation Needed: No OT Evalulation Needed: No SLP Evaluation Needed: No Abuse/Neglect Assessment (Assessment to be complete while patient is alone) Physical Abuse: Denies Verbal Abuse: Denies Sexual Abuse: Denies Exploitation of patient/patient's resources: Denies Self-Neglect: Denies Values / Beliefs Cultural Requests During Hospitalization: None Spiritual Requests During Hospitalization: None Consults Spiritual Care Consult Needed: No Social Work Consult Needed: No Merchant navy officerAdvance Directives (For Healthcare) Does Patient Have a Medical Advance Directive?: No Would patient like information on creating a medical advance directive?: No - Patient declined          Disposition: Case was staffed with Arville CareParks FNP who evaluated patient for possible medication interventions and states patient does not meet inpatient criteria. Patient will be provided with OP resources on discharge.    Disposition Initial Assessment Completed for this Encounter: Yes Disposition of Patient: Discharge Patient refused recommended treatment: No Mode of transportation if patient is discharged?: Loreli Slot(UNK)  On Site Evaluation by:   Reviewed with Physician:    Alfredia Fergusonavid L Samariya Rockhold 10/30/2017 2:06 PM

## 2017-10-30 NOTE — ED Triage Notes (Signed)
Per EMS pt coming from home with c/o animal bite. EMS reports pt was setting out traps and got bit by a chipmunk. Pt unsure of last tetanus shot. Per EMS pt also c/o depression, sts can't get up out of the bed in the morning, and is having thoughts of hurting herself.

## 2017-10-30 NOTE — ED Notes (Signed)
ANIMAL CONTROL AT BEDSIDE. OFFICER BADGER NUMBER [id number]. LOW % OF RABIES INCIDENCE WITH THIS ANIMAL. "WOULD NOT HAVE SENT THIS ANIMAL FOR RABIES TESTING IF CAPTURED". ADVISED TO DISCUSS WITH WITH DOCTOR RABIES VACCINATION PLAN OF CARE AT HER EXPENSE.  AT PRESENT, PT DECLINES RABIES VACCINATION.

## 2017-10-30 NOTE — BH Assessment (Signed)
[facility] Assessment Progress Note  Case was staffed with Arville CareParks FNP who evaluated patient for possible medication interventions and states patient does not meet inpatient criteria. Patient will be provided with OP resources on discharge.

## 2017-10-30 NOTE — ED Notes (Signed)
X-ray at bedside

## 2017-10-30 NOTE — Progress Notes (Addendum)
Patient ID: Alyssa LamerConstance F Cameron, female   DOB: 1981/01/14, 37 y.o.   MRN: [id number]  Pt was seen and chart reviewed with treatment team and Dr Sharma CovertNorman.  Pt denies suicidal/homicidal ideation, denies auditory/visual hallucinations and does not appear to be responding to internal stimuli. Pt has been having some difficulty sleeping and some anxiety. Pt will be provided a prescription for hydroxyzine 25 mg three times daily as needed for sleep/anxiety. Pt will also be given outpatient resources for follow up in the community for therapy and medication management. Pt is stable for discharge.   Laveda AbbeLaurie Britton Parks, NP-C 10-30-2017         1354  Patient's chart reviewed and case discussed with the physician extender and developed treatment plan. Reviewed the information documented and agree with the treatment plan.  Juanetta BeetsJacqueline Kelie Gainey, DO 10/30/17 3:22 PM

## 2017-10-30 NOTE — ED Notes (Addendum)
Pt refused to allow RN to update her vitals.

## 2019-10-11 ENCOUNTER — Ambulatory Visit: Payer: Medicaid Other | Admitting: Family Medicine

## 2020-08-08 IMAGING — DX DG FINGER THUMB 2+V*L*
3 series · 3 of 3 positions shown · non-contrast
Comparison: None.

CLINICAL DATA: Bit by chip months.

EXAM:
LEFT THUMB 2+V

[finger ap]
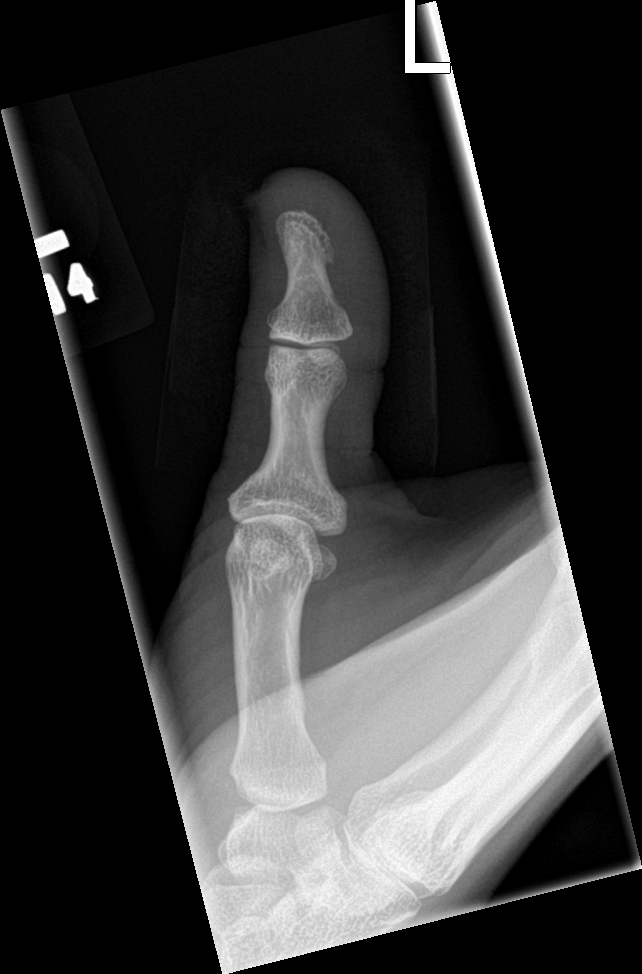

[finger obl]
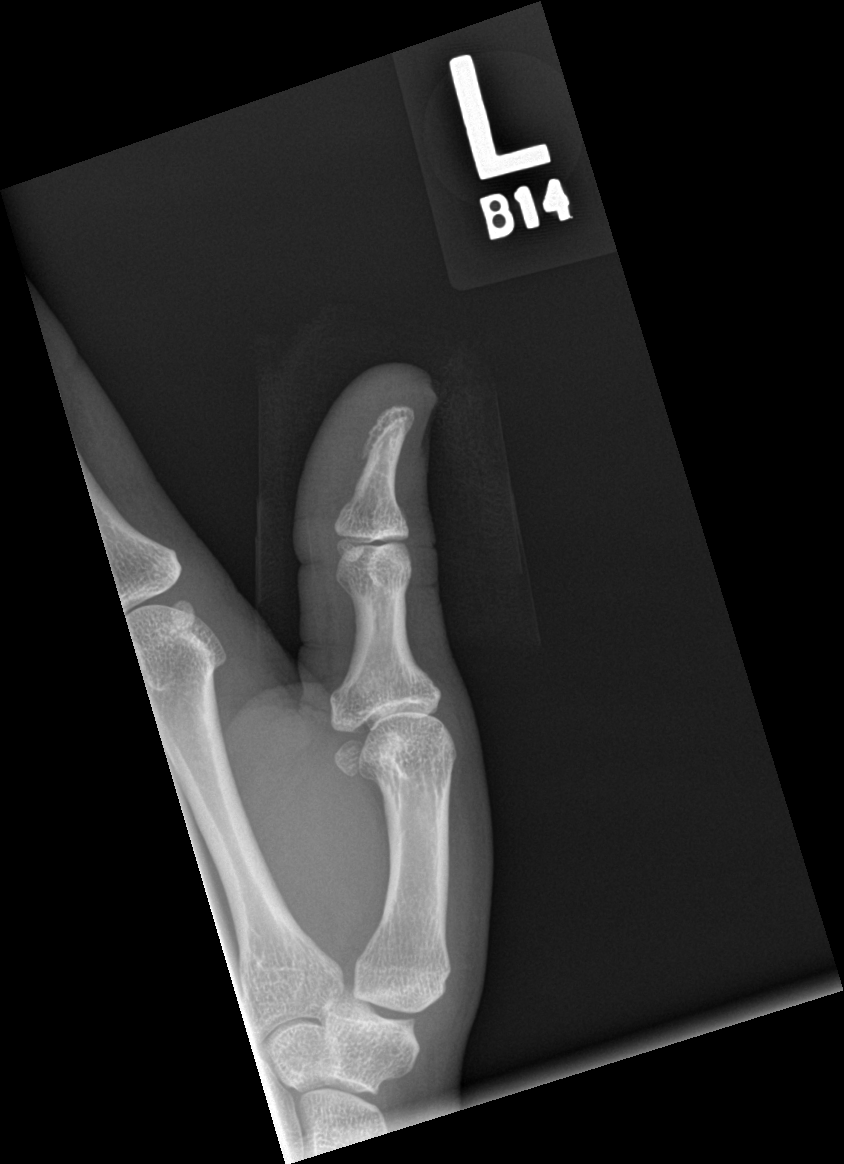

[finger lat]
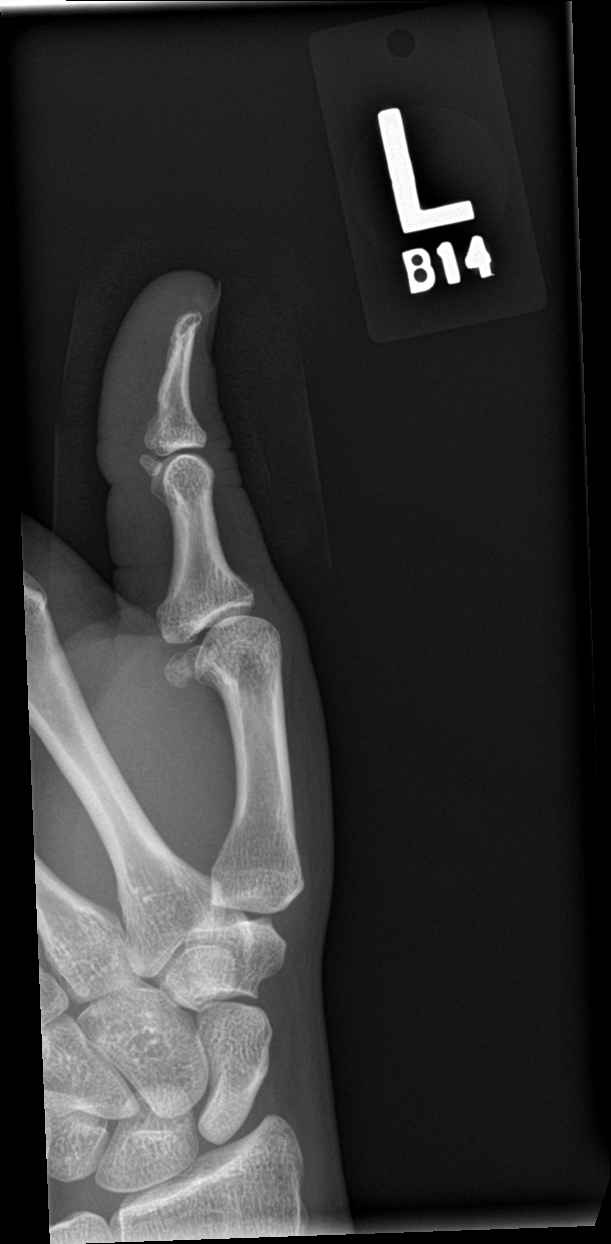

[3 of 3 positions shown; findings below may reference images not displayed]

FINDINGS: There is no evidence of fracture or dislocation. There is no
evidence of arthropathy or other focal bone abnormality. Soft
tissues are unremarkable. No soft tissue gas or radiopaque foreign
body.
IMPRESSION: Negative.

## 2023-08-10 ENCOUNTER — Telehealth: Payer: Self-pay | Admitting: General Practice

## 2023-08-10 NOTE — Telephone Encounter (Signed)
Not a patient of [facility].

## 2023-08-10 NOTE — Telephone Encounter (Signed)
 Copied from CRM [id number]. Topic: Clinical - Medication Refill >> Aug 10, 2023  8:36 AM Jalayah J wrote: Medication: Methylphenidate (not list)   Has the patient contacted their pharmacy? Yes (Agent: If no, request that the patient contact the pharmacy for the refill. If patient does not wish to contact the pharmacy document the reason why and proceed with request.) (Agent: If yes, when and what did the pharmacy advise?)  This is the patient's preferred pharmacy:  [facility] [address] [place], [place]  Phone: [phone]    Is this the correct pharmacy for this prescription? Yes If no, delete pharmacy and type the correct one.   Has the prescription been filled recently? Yes  Is the patient out of the medication? Yes  Has the patient been seen for an appointment in the last year OR does the patient have an upcoming appointment? Yes  Can we respond through MyChart? Yes  Agent: Please be advised that Rx refills may take up to 3 business days. We ask that you follow-up with your pharmacy.

## 2023-09-01 ENCOUNTER — Ambulatory Visit (INDEPENDENT_AMBULATORY_CARE_PROVIDER_SITE_OTHER): Payer: MEDICAID | Admitting: Family Medicine

## 2023-09-01 ENCOUNTER — Encounter: Payer: Self-pay | Admitting: Family Medicine

## 2023-09-01 VITALS — BP 100/60 | HR 128 | Temp 98.2°F | Ht 66.0 in | Wt 133.0 lb

## 2023-09-01 DIAGNOSIS — J302 Other seasonal allergic rhinitis: Secondary | ICD-10-CM | POA: Insufficient documentation

## 2023-09-01 DIAGNOSIS — F902 Attention-deficit hyperactivity disorder, combined type: Secondary | ICD-10-CM | POA: Insufficient documentation

## 2023-09-01 DIAGNOSIS — F439 Reaction to severe stress, unspecified: Secondary | ICD-10-CM | POA: Diagnosis not present

## 2023-09-01 DIAGNOSIS — Z1159 Encounter for screening for other viral diseases: Secondary | ICD-10-CM | POA: Insufficient documentation

## 2023-09-01 DIAGNOSIS — Z7689 Persons encountering health services in other specified circumstances: Secondary | ICD-10-CM | POA: Insufficient documentation

## 2023-09-01 DIAGNOSIS — E559 Vitamin D deficiency, unspecified: Secondary | ICD-10-CM | POA: Diagnosis not present

## 2023-09-01 MED ORDER — CETIRIZINE HCL 10 MG PO TABS: 10.0000 mg | ORAL_TABLET | Freq: Every day | ORAL | 1 refills | Status: AC

## 2023-09-01 NOTE — Progress Notes (Signed)
 I,[name], CMA,acting as a Neurosurgeon for [employer], NP.,have documented all relevant documentation on the behalf of Bruna Creighton, NP,as directed by  Bruna Creighton, NP while in the presence of Bruna Creighton, NP.  Subjective:  Patient ID: Alyssa Price , female    DOB: 22-Aug-1980 , 43 y.o.   MRN: [id number]  Chief Complaint  Patient presents with   Establish Care   Hypertension    Patient presents today for bpc. Patient reports she doesn't take any medicine.     HPI Discussed the use of AI scribe software for clinical note transcription with the patient, who gave verbal consent to proceed.  History of Present Illness     Alyssa Price is a 43 year old female who presents to establish care and get refill for allergy medication  and management of allergy symptoms.  She experiences coughing, difficulty breathing, headaches, and nasal congestion with rhinorrhea. She uses a small white pill for allergies but cannot recall its name. She previously used Flonase  but prefers not to continue due to negative childhood experiences. Without allergy medication, she experiences throat irritation and persistent coughing.  She has a history of ADHD and is currently taking Concerta 54 mg ER every day , which she finds helpful but wishes it were stronger. She has been receiving ADHD management from  [facility], [place] but wants to transfer care closer to home due to the long drive.  Her social situation is challenging as her husband has recently moved out, leaving her with financial difficulties. She has two children, a 63 year old daughter and a 79 year old son, both with autism. She is struggling with food insecurity and financial instability, relying on a monthly check and leftover change for sustenance. She does not have a job and is concerned about her ability to support her children.  No history of drug use, smoking, or alcohol consumption. She does not like  sleeping but does sleep at night. She is not on birth control and has had her tubes tied.      Past Medical History:  Diagnosis Date   Diabetes mellitus without complication (HCC)    Hypertension      Family History  Problem Relation Age of Onset   Diabetes Mother    Hyperlipidemia Mother    Heart attack Father    Lung cancer Paternal Grandfather      Current Outpatient Medications:    cetirizine  (ZYRTEC  ALLERGY) 10 MG tablet, Take 1 tablet (10 mg total) by mouth daily., Disp: 90 tablet, Rfl: 1   methylphenidate 54 MG PO CR tablet, Take 54 mg by mouth daily as needed., Disp: , Rfl:    amoxicillin -clavulanate (AUGMENTIN ) 875-125 MG tablet, Take 1 tablet by mouth every 12 (twelve) hours. (Patient not taking: Reported on 09/01/2023), Disp: 14 tablet, Rfl: 0   fluticasone  (FLONASE ) 50 MCG/ACT nasal spray, Place 2 sprays into both nostrils daily. (Patient not taking: Reported on 09/01/2023), Disp: 16 g, Rfl: 2   hydrOXYzine  (ATARAX /VISTARIL ) 25 MG tablet, Take 1 tablet (25 mg total) by mouth 3 (three) times daily as needed for anxiety. (Patient not taking: Reported on 09/01/2023), Disp: 30 tablet, Rfl: 0   Multiple Vitamin (MULTIVITAMIN WITH MINERALS) TABS tablet, Take 1 tablet by mouth daily. (Patient not taking: Reported on 09/01/2023), Disp: , Rfl:    No Known Allergies   Review of Systems  Constitutional: Negative.   HENT:  Positive for postnasal drip.   Respiratory:  Positive for cough.  Cardiovascular: Negative.   Gastrointestinal: Negative.   Genitourinary: Negative.   Psychiatric/Behavioral:  The patient is nervous/anxious and is hyperactive.      Today's Vitals   09/01/23 1537  BP: 100/60  Pulse: (!) 128  Temp: 98.2 F (36.8 C)  TempSrc: Oral  Weight: 133 lb (60.3 kg)  Height: 5' 6 (1.676 m)  PainSc: 0-No pain   Body mass index is 21.47 kg/m.  Wt Readings from Last 3 Encounters:  09/01/23 133 lb (60.3 kg)  10/30/17 165 lb (74.8 kg)  12/26/13 123 lb 9.6 oz  (56.1 kg)    The ASCVD Risk score (Arnett DK, et al., 2019) failed to calculate for the following reasons:   Cannot find a previous HDL lab   Cannot find a previous total cholesterol lab  Objective:  Physical Exam HENT:     Head: Normocephalic.  Cardiovascular:     Rate and Rhythm: Tachycardia present.  Pulmonary:     Effort: Pulmonary effort is normal.     Breath sounds: Normal breath sounds.  Skin:    General: Skin is warm and dry.  Neurological:     Mental Status: She is alert.  Psychiatric:        Mood and Affect: Affect is inappropriate.        Behavior: Behavior is hyperactive.         Assessment And Plan:  Establishing care with new doctor, encounter for  Seasonal allergies -     Cetirizine  HCl; Take 1 tablet (10 mg total) by mouth daily.  Dispense: 90 tablet; Refill: 1  ADHD (attention deficit hyperactivity disorder), combined type -     CBC -     CMP14+EGFR -     Amb ref to Integrated Behavioral Health  Stress at home -     AMB Referral VBCI Care Management  Vitamin D  deficiency -     VITAMIN D  25 Hydroxy (Vit-D Deficiency, Fractures)  Encounter for hepatitis C screening test for low risk patient -     Hepatitis C antibody   Assessment and Plan Assessment & Plan Attention-Deficit/Hyperactivity Disorder (ADHD) ADHD managed with Concerta. Current medication insufficient. She desires stronger medication due to stressors. Wishes to transfer care to this clinic. - Refer to in-house mental health team for ADHD management. - Coordinate transfer of ADHD care to this clinic.  Social and Financial Instability Significant instability due to separation, unemployment, and responsibility for two adult children with autism. Insufficient food and financial resources.  - Refer to social work for assistance with financial resources, housing, and food security. - Prioritize emergency referral to social work due to immediate needs.  Allergic Rhinitis Chronic allergic  rhinitis managed with oral antihistamines. Prefers oral medication over nasal sprays. - Prescribe cetirizine , one tablet daily for allergy symptoms. - Provide samples of cetirizine  for immediate use.  General Health Maintenance No recent blood work or primary care provider. Agrees to establish care at this clinic. - Perform blood work to establish baseline health status. - Establish primary care at this clinic for ongoing health maintenance.   Return in about 5 months (around 02/01/2024) for physical.  Patient was given opportunity to ask questions. Patient verbalized understanding of the plan and was able to repeat key elements of the plan. All questions were answered to their satisfaction.   SABRA LILLETTE Bruna Petrina, NP, have reviewed all documentation for this visit. The documentation on 09/01/2023 for the exam, diagnosis, procedures, and orders are all accurate and complete.  IF YOU HAVE BEEN REFERRED TO A SPECIALIST, IT MAY TAKE 1-2 WEEKS TO SCHEDULE/PROCESS THE REFERRAL. IF YOU HAVE NOT HEARD FROM US /SPECIALIST IN TWO WEEKS, PLEASE GIVE US  A CALL AT [phone] X [phone].

## 2023-09-02 LAB — CBC
Hematocrit: 35.4 % (ref 34.0–46.6)
Hemoglobin: 10.6 g/dL — ABNORMAL LOW (ref 11.1–15.9)
MCH: 24.1 pg — ABNORMAL LOW (ref 26.6–33.0)
MCHC: 29.9 g/dL — ABNORMAL LOW (ref 31.5–35.7)
MCV: 81 fL (ref 79–97)
Platelets: 473 x10E3/uL — ABNORMAL HIGH (ref 150–450)
RBC: 4.39 x10E6/uL (ref 3.77–5.28)
RDW: 16.3 % — ABNORMAL HIGH (ref 11.7–15.4)
WBC: 5.2 x10E3/uL (ref 3.4–10.8)

## 2023-09-02 LAB — CMP14+EGFR
ALT: 11 IU/L (ref 0–32)
AST: 15 IU/L (ref 0–40)
Albumin: 4.4 g/dL (ref 3.9–4.9)
Alkaline Phosphatase: 95 IU/L (ref 44–121)
BUN/Creatinine Ratio: 13 (ref 9–23)
BUN: 10 mg/dL (ref 6–24)
Bilirubin Total: 0.5 mg/dL (ref 0.0–1.2)
CO2: 23 mmol/L (ref 20–29)
Calcium: 9.1 mg/dL (ref 8.7–10.2)
Chloride: 101 mmol/L (ref 96–106)
Creatinine, Ser: 0.77 mg/dL (ref 0.57–1.00)
Globulin, Total: 3.4 g/dL (ref 1.5–4.5)
Glucose: 103 mg/dL — ABNORMAL HIGH (ref 70–99)
Potassium: 3.6 mmol/L (ref 3.5–5.2)
Sodium: 139 mmol/L (ref 134–144)
Total Protein: 7.8 g/dL (ref 6.0–8.5)
eGFR: 99 mL/min/1.73 (ref >59)

## 2023-09-02 LAB — HEPATITIS C ANTIBODY: Hep C Virus Ab: NONREACTIVE

## 2023-09-02 LAB — VITAMIN D 25 HYDROXY (VIT D DEFICIENCY, FRACTURES): Vit D, 25-Hydroxy: 47.7 ng/mL (ref 30.0–100.0)

## 2023-09-03 ENCOUNTER — Telehealth: Payer: Self-pay

## 2023-09-03 NOTE — Progress Notes (Signed)
 Complex Care Management Note Care Guide Note  09/03/2023 Name: Alyssa Price MRN: [id number] DOB: 18-Oct-1980   Complex Care Management Outreach Attempts: An unsuccessful telephone outreach was attempted today to offer the patient information about available complex care management services.  Follow Up Plan:  Additional outreach attempts will be made to offer the patient complex care management information and services.   Encounter Outcome:  No Answer  Leotis Rase [facility], [facility] Guide  Direct Dial: [phone]  Fax [phone]

## 2023-09-04 ENCOUNTER — Ambulatory Visit: Payer: Self-pay | Admitting: Family Medicine

## 2023-09-04 NOTE — Progress Notes (Signed)
 Complex Care Management Note Care Guide Note  09/04/2023 Name: Alyssa Price MRN: [id number] DOB: Feb 05, 1981   Complex Care Management Outreach Attempts: A second unsuccessful outreach was attempted today to offer the patient with information about available complex care management services.  Follow Up Plan:  Additional outreach attempts will be made to offer the patient complex care management information and services.   Encounter Outcome:  No Answer  Leotis Rase [facility], [facility] Guide  Direct Dial: [phone]  Fax [phone]

## 2023-09-07 ENCOUNTER — Other Ambulatory Visit: Payer: Self-pay | Admitting: Family Medicine

## 2023-09-07 NOTE — Progress Notes (Signed)
 Complex Care Management Note  Care Guide Note 09/07/2023 Name: Alyssa Price MRN: [id number] DOB: 1980-03-20  Alyssa Price is a 43 y.o. year old female who sees Patient, No Pcp Per for primary care. I reached out to Garen JULIANNA Pouch by phone today to offer complex care management services.  Ms. Langner was given information about Complex Care Management services today including:   The Complex Care Management services include support from the care team which includes your Nurse Care Manager, Clinical Social Worker, or Pharmacist.  The Complex Care Management team is here to help remove barriers to the health concerns and goals most important to you. Complex Care Management services are voluntary, and the patient may decline or stop services at any time by request to their care team member.   Complex Care Management Consent Status: Patient agreed to services and verbal consent obtained.   Follow up plan:  Telephone appointment with complex care management team member scheduled for:  10/01/23 and 10/07/23  Encounter Outcome:  Patient Scheduled   Leotis Rase [facility] Guide  Direct Dial: [phone]  Fax [phone]

## 2023-09-07 NOTE — Telephone Encounter (Signed)
 Copied from CRM [id number]. Topic: Clinical - Medication Refill >> Sep 07, 2023 11:13 AM Delon HERO wrote: Medication: methylphenidate  54 MG PO CR tablet [[id number]] New Patient established care 09/01/23  Has the patient contacted their pharmacy? Yes (Agent: If no, request that the patient contact the pharmacy for the refill. If patient does not wish to contact the pharmacy document the reason why and proceed with request.) (Agent: If yes, when and what did the pharmacy advise?)  This is the patient's preferred pharmacy:    [id number] [place], [place] - [address] [place] [address] Phone: [phone] Fax: [phone]  Is this the correct pharmacy for this prescription? Yes If no, delete pharmacy and type the correct one.   Has the prescription been filled recently? Yes  Is the patient out of the medication? Yes Patient took the last pill yesterday  Has the patient been seen for an appointment in the last year OR does the patient have an upcoming appointment? Yes  Can we respond through MyChart? Yes  Agent: Please be advised that Rx refills may take up to 3 business days. We ask that you follow-up with your pharmacy.

## 2023-09-08 ENCOUNTER — Telehealth: Payer: Self-pay | Admitting: *Deleted

## 2023-09-08 NOTE — Progress Notes (Signed)
 Complex Care Management Note Care Guide Note  09/08/2023 Name: MARILOUISE DENSMORE MRN: [id number] DOB: 02-Jun-1980   Complex Care Management Outreach Attempts: An unsuccessful telephone outreach was attempted today to offer the patient information about available complex care management services.  Follow Up Plan:  Additional outreach attempts will be made to offer the patient complex care management information and services.   Encounter Outcome:  No Answer  Asencion [facility] Guide  Direct [phone] Fax:[phone] Website: [email or web address]

## 2023-09-09 ENCOUNTER — Telehealth: Payer: Self-pay | Admitting: General Practice

## 2023-09-09 ENCOUNTER — Telehealth: Payer: Self-pay | Admitting: *Deleted

## 2023-09-09 ENCOUNTER — Other Ambulatory Visit: Payer: Self-pay | Admitting: Family Medicine

## 2023-09-09 MED ORDER — METHYLPHENIDATE HCL ER (OSM) 54 MG PO TBCR: 54.0000 mg | EXTENDED_RELEASE_TABLET | Freq: Every day | ORAL | 0 refills | Status: AC | PRN

## 2023-09-09 NOTE — Telephone Encounter (Signed)
 Copied from CRM [id number]. Topic: Clinical - Prescription Issue >> Sep 09, 2023  9:53 AM Kevelyn M wrote:  Reason for CRM: Patient calling in about medication refill request for methylphenidate  54 MG PO CR tablet. Please advise.

## 2023-09-09 NOTE — Progress Notes (Signed)
 Complex Care Management Note Care Guide Note  09/09/2023 Name: NANDINI BOGDANSKI MRN: [id number] DOB: [birth year]   Complex Care Management Outreach Attempts: An unsuccessful telephone outreach was attempted today to offer the patient information about available complex care management services.  Follow Up Plan:  Additional outreach attempts will be made to offer the patient complex care management information and services.   Encounter Outcome:  Patient Request to Call Back Patient declined to talk about SDOH needs she has an appt with some one who she thinks will be helping her with the things I offered to help with food , utilities etc said to call back friday evening after she had this other meeting  Asencion Gell  [facility] HealthPopulation Health Care Guide  Direct Dial:[phone] Fax:[phone] Website: [email or web address]

## 2023-09-09 NOTE — Telephone Encounter (Signed)
 No Pcp on file

## 2023-09-14 ENCOUNTER — Telehealth: Payer: Self-pay | Admitting: *Deleted

## 2023-09-14 NOTE — Progress Notes (Signed)
 Complex Care Management Note Care Guide Note  09/14/2023 Name: [name] MRN: [id number] DOB: 1980-08-12  Alyssa Price is a 43 y.o. year old female who is a primary care patient of Petrina Pries, NP . The community resource team was consulted for assistance with Transportation Needs , Food Insecurity, and Financial Difficulties related to husband leaving   SDOH screenings and interventions completed:  Yes  Social Drivers of Health From This Encounter   Food Insecurity: Food Insecurity Present (09/14/2023)   Hunger Vital Sign    Worried About Running Out of Food in the Last Year: Sometimes true    Ran Out of Food in the Last Year: Sometimes true  Housing: Unknown (09/14/2023)   Housing Stability Vital Sign    Homeless in the Last Year: No  Financial Resource Strain: High Risk (09/14/2023)   Overall Financial Resource Strain (CARDIA)    Difficulty of Paying Living Expenses: Hard  Transportation Needs: No Transportation Needs (09/14/2023)   PRAPARE - Administrator, Civil Service (Medical): No    Lack of Transportation (Non-Medical): No  Utilities: Not At Risk (09/14/2023)   Utilities    Threatened with loss of utilities: No    SDOH Interventions Today    Flowsheet Row Most Recent Value  SDOH Interventions   Food Insecurity Interventions [employer] [employer], [employer] Provided  [employer] worked out and has food pantries]  Housing [employer] Provided, Other (Comment)  [Has no idea , if husband]  Transportation Interventions Payor Benefit  [provided trillium information]  Utilities Interventions AMB Referral, Programmer, applications Provided  [employer] no ideas]  Financial Strain Interventions [employer] Provided  Stress Interventions Community Resources Provided     Care guide performed the following interventions: Patient provided with information about care guide support team and interviewed to confirm resource  needs.  Follow Up Plan:  No further follow up planned at this time. The patient has been provided with needed resources.  Encounter Outcome:  Patient Visit Completed [name]  [facility] HealthPopulation Health Care Guide  Direct [phone] Fax:[phone] Website: [email or web address]

## 2023-10-01 ENCOUNTER — Telehealth: Payer: Self-pay | Admitting: Licensed Clinical Social Worker

## 2023-10-07 ENCOUNTER — Other Ambulatory Visit: Payer: Self-pay

## 2023-10-07 DIAGNOSIS — F411 Generalized anxiety disorder: Secondary | ICD-10-CM

## 2023-10-07 DIAGNOSIS — F439 Reaction to severe stress, unspecified: Secondary | ICD-10-CM

## 2023-10-07 DIAGNOSIS — J302 Other seasonal allergic rhinitis: Secondary | ICD-10-CM

## 2023-10-07 DIAGNOSIS — F902 Attention-deficit hyperactivity disorder, combined type: Secondary | ICD-10-CM

## 2023-10-07 NOTE — Patient Outreach (Addendum)
 Complex Care Management   Visit Note  10/07/2023  Name:  Alyssa Price MRN: [id number] DOB: 1980/05/12  Situation: Referral received for Complex Care Management related to Stress at Home, Seasonal Allergies, ADHD (attention deficit hyperactivity disorder), combined type, Anxiety State, unintentional weight loss. I obtained verbal consent from Patient.  Visit completed with Patient on the phone.  Background:   Past Medical History:  Diagnosis Date   Diabetes mellitus without complication (HCC)    Hypertension     Assessment: Patient Reported Symptoms:  Cognitive Cognitive Status: Alert and oriented to person, place, and time, Normal speech and language skills, Difficulties with attention and concentration, Struggling with memory recall Cognitive/Intellectual Conditions Management [RPT]: None reported or documented in medical history or problem list      Neurological Neurological Review of Symptoms: Headaches Neurological Management Strategies: Medication therapy, Routine screening Neurological Self-Management Outcome: 4 (good) Neurological Comment: headaches related to allergies  HEENT HEENT Symptoms Reported: Nasal discharge HEENT Management Strategies: Medication therapy, Routine screening HEENT Self-Management Outcome: 4 (good)    Cardiovascular Cardiovascular Symptoms Reported: No symptoms reported    Respiratory Respiratory Symptoms Reported: Productive cough (states cough is from allergies) Respiratory Management Strategies: Routine screening, Medication therapy Respiratory Self-Management Outcome: 4 (good)  Endocrine Endocrine Symptoms Reported: No symptoms reported    Gastrointestinal Gastrointestinal Symptoms Reported: Unintentional weight loss Gastrointestinal Self-Management Outcome: 3 (uncertain)      Genitourinary Genitourinary Symptoms Reported: No symptoms reported    Integumentary Integumentary Symptoms Reported: No symptoms reported     Musculoskeletal Musculoskelatal Symptoms Reviewed: No symptoms reported        Psychosocial Psychosocial Symptoms Reported: Anxiety - if selected complete GAD, Depression - if selected complete PHQ 2-9, Difficulty concentrating Behavioral Management Strategies: Medication therapy, Counseling Behavioral Health Self-Management Outcome: 3 (uncertain) Major Change/Loss/Stressor/Fears (CP): Relationship concerns, Resources Techniques to [employer] with Loss/Stress/Change: Counseling, Medication Quality of Family Relationships: non-existent Do you feel physically threatened by others?: No    10/07/2023    PHQ2-9 Depression Screening   Alyssa Price interest or pleasure in doing things Nearly every day  Feeling down, depressed, or hopeless Nearly every day  PHQ-2 - Total Score 6  Trouble falling or staying asleep, or sleeping too much Not at all  Feeling tired or having Alyssa Price energy Not at all  Poor appetite or overeating  Several days  Feeling bad about yourself - or that you are a failure or have let yourself or your family down Nearly every day  Trouble concentrating on things, such as reading the newspaper or watching television Several days  Moving or speaking so slowly that other people could have noticed.  Or the opposite - being so fidgety or restless that you have been moving around a lot more than usual Several days  Thoughts that you would be better off dead, or hurting yourself in some way Nearly every day  PHQ2-9 Total Score 15  If you checked off any problems, how difficult have these problems made it for you to do your work, take care of things at home, or get along with other people Not difficult at all  Depression Interventions/Treatment Medication, Currently on Treatment    There were no vitals filed for this visit.  Medications Reviewed Today     Reviewed by Alyssa Alyssa CROME, Price (Registered Nurse) on 10/07/23 at 1350  Med List Status: <None>   Medication Order Taking? Sig  Documenting Provider Last Dose Status Informant  amoxicillin -clavulanate (AUGMENTIN ) 875-125 MG tablet [id number]  Take 1 tablet  by mouth every 12 (twelve) hours.  Patient not taking: Reported on 09/01/2023   Alyssa Folks, MD  Consider Medication Status and Discontinue (Completed Course)   cetirizine  (ZYRTEC  ALLERGY) 10 MG tablet [id number] Yes Take 1 tablet (10 mg total) by mouth daily. Alyssa Pries, NP  Active   fluticasone  (FLONASE ) 50 MCG/ACT nasal spray [id number]  Place 2 sprays into both nostrils daily.  Patient not taking: Reported on 10/07/2023   Price, Deepak, MD  Active Self  hydrOXYzine  (ATARAX /VISTARIL ) 25 MG tablet [id number]  Take 1 tablet (25 mg total) by mouth 3 (three) times daily as needed for anxiety.  Patient not taking: Reported on 09/01/2023   Alyssa Mitzie Retort, NP  Consider Medication Status and Discontinue (Completed Course)   methylphenidate  54 MG PO CR tablet [id number] Yes Take 1 tablet (54 mg total) by mouth daily as needed.  Patient taking differently: Take 54 mg by mouth daily as needed. Patient is taking 1 tablet daily.   Alyssa Pries, NP  Active   Multiple Vitamin (MULTIVITAMIN WITH MINERALS) TABS tablet [id number]  Take 1 tablet by mouth daily.  Patient not taking: Reported on 10/07/2023   [provider]  Active Self           Recommendation:   Acute PCP follow-up with Alyssa Garret FNP-BC on tomorrow, 10/08/23 at 11:15 AM  Follow Up Plan:   Telephone follow up appointment date/time: 10/14/23 at 12:45 PM with social worker Alyssa Price Telephone follow up appointment date/time:  10/19/23 at 2:30 PM with nurse care manager Alyssa Price  Referral to Alyssa Price  Alyssa Price BSN [facility] [place]  [facility], [facility] Health Nurse Care Coordinator  Direct Dial: [phone] Website: [email or web address]

## 2023-10-08 NOTE — Patient Instructions (Addendum)
 Visit Information  Thank you for taking time to visit with me today. Please don't hesitate to contact me if I can be of assistance to you before our next scheduled appointment.  Your next telephone appointment is by telephone on Friday, August 29 at 3:00 PM with Tobias Moose BSW   Please call the care guide team at [phone] if you need to cancel or reschedule your appointment.   Following is a copy of your care plan:   Goals Addressed             This Visit's Progress    VBCI RN Care Plan related to Anxiety and Depression secondary to Home Stress       Problems:  Financial Constraints. Knowledge Deficits related to Depression: depressed mood Literacy barriers  Goal: Over the next 14 days the Patient will demonstrate ongoing self health care management ability to manage symptoms of depression and anxiety  as evidenced by    will establish with a [employer] case manager/peer support counselor to assist with her mental health needs and ability to assist her with learning how to manage a household and mental health needs  Interventions:   Interdisciplinary Collaboration Interventions:  Collaborated with Rolin Kerns LCSW and Tobias Moose BSW to initiate plan of care to address needs related to Limited social support, Mental Health Concerns , Family and relationship dysfunction, Literacy concerns, and Lacks knowledge of community resource in patient with Anxiety, Depression, and ADHS and Home Stress Reviewed patient's medication regimen, educated re: the importance of medication adherence and completed medication reconciliation  Collaboration with Petrina Pries, NP regarding development and update of comprehensive plan of care as evidenced by provider attestation and co-signature Inter-disciplinary care team collaboration  Placed outbound call to patient's mental health provider, Tinnie Garret FNP-BH with [facility] in [place], reported patient's symptoms of worsening  depression and anxiety, scheduled patient a follow up with the above stated provider for tomorrow, 10/08/23 at 11:15 AM, patient made aware and verbalizes she will be able to drive herself to this appointment  Provided patient with the following crisis hotline information and she recorded the information provided and repeated back the contact information  call the Suicide and Crisis Lifeline: 988 call 1-800-273-TALK (toll free, 24 hour hotline) if experiencing a Mental Health or Behavioral Health Crisis  Patient Self-Care Activities:  Attend all scheduled provider appointments Call pharmacy for medication refills 3-7 days in advance of running out of medications Call provider office for new concerns or questions  Take medications as prescribed   call the Suicide and Crisis Lifeline: 988 call 1-800-273-TALK (toll free, 24 hour hotline) if experiencing a Mental Health or Behavioral Health Crisis  Plan:  Keep your scheduled appointment with Tinnie Garret FNP-BH with [facility] in [place]  Work with the social worker to address care coordination needs and will continue to work with the clinical team to address health care and disease management related needs         Please call the Suicide and Crisis Lifeline: 988 call 1-800-273-TALK (toll free, 24 hour hotline) if you are experiencing a Mental Health or Behavioral Health Crisis or need someone to talk to.  The patient verbalized understanding of instructions, educational materials, and care plan provided today and DECLINED offer to receive copy of patient instructions, educational materials, and care plan.   Clayborne Ly RN BSN CCM [place]  [facility], [facility] Health Nurse Care Coordinator  Direct Dial: [phone] Website: [email or web address]  '

## 2023-10-09 ENCOUNTER — Other Ambulatory Visit: Payer: MEDICAID | Admitting: Licensed Clinical Social Worker

## 2023-10-13 NOTE — Patient Instructions (Signed)
 Visit Information  Thank you for taking time to visit with me today. Please don't hesitate to contact me if I can be of assistance to you before our next scheduled appointment.  Our next appointment is by telephone  once the SW receives a call or an email from [facility]  Please call the care guide team at [phone] if you need to cancel or reschedule your appointment.   Following is a copy of your care plan:   Goals Addressed   None     Please call the Suicide and Crisis Lifeline: 988 go to [facility] Urgent [facility] [address], [place] [phone]) call 911 if you are experiencing a Mental Health or Behavioral Health Crisis or need someone to talk to.  The patient verbalized understanding of instructions, educational materials, and care plan provided today and DECLINED offer to receive copy of patient instructions, educational materials, and care plan.   Tobias CHARM Maranda HEDWIG, PhD [facility], [facility] Social Worker Direct Dial: [phone]  Fax: [phone]

## 2023-10-13 NOTE — Patient Outreach (Signed)
 Complex Care Management   Visit Note  10/09/2023  Name:  Alyssa Price MRN: [id number] DOB: February 16, 1980  Situation: Referral received for Complex Care Management related to [facility]  I obtained verbal consent from Patient.  Visit completed with Patient  on the phone  Background:   Past Medical History:  Diagnosis Date   Diabetes mellitus without complication (HCC)    Hypertension     Assessment: SW sent an online referral to [facility] to request the Case managers name to connect with patient    SDOH Interventions    Flowsheet Row Patient Outreach Telephone from 10/09/2023 in [place] POPULATION HEALTH DEPARTMENT Patient Outreach Telephone from 10/07/2023 in [place] POPULATION HEALTH DEPARTMENT Telephone from 09/14/2023 in [place] POPULATION HEALTH DEPARTMENT  SDOH Interventions     Food Insecurity Interventions Community Resources Provided  [SW will mail the food pantry list and the patient will go DSS and apply fo rfood stamps] AMB Referral [facility], [employer] Resources Provided  [employer] worked out and has food pantries]  Housing Interventions Intervention Not Indicated, Chemical engineer, Programmer, applications Provided  [employer] to [place] CM] Intervention Not Indicated [employer] Provided, Other (Comment)  [Has no idea , if husband]  Transportation Interventions Intervention Not Indicated Intervention Not Indicated Payor Benefit  [provided [employer]ium information]  Utilities Interventions Intervention Not Indicated Intervention Not Indicated AMB Referral, Community Resources Provided  [employer] no ideas]  Alcohol Usage Interventions -- Intervention Not Indicated (Score <7) --  Depression Interventions/Treatment  -- Medication, Currently on Treatment --  Financial Strain Interventions [employer] Resources Provided -- [employer] Provided  Stress Interventions -- -- Programmer, applications Provided  Health Literacy Interventions --  AMB Referral  [patient scheduled to f/u with LCSW Jasmine Lewis] --      Recommendation:   Trying to connect to [facility] case Production designer, theatre/television/film  Follow Up Plan:   Telephone follow up appointment date/time:  SW will call once she hears back form Jarrell Tobias CHARM Maranda HEDWIG, PhD [facility], [facility] Social Worker Direct Dial: [phone]  Fax: [phone]

## 2023-10-14 ENCOUNTER — Other Ambulatory Visit: Payer: MEDICAID | Admitting: Licensed Clinical Social Worker

## 2023-10-15 NOTE — Patient Instructions (Signed)
 Visit Information  Thank you for taking time to visit with me today. Please don't hesitate to contact me if I can be of assistance to you before our next scheduled appointment.  Your next care management appointment is by telephone on 09/19 at 1:30 PM  Please call the care guide team at [phone] if you need to cancel, schedule, or reschedule an appointment.   Please call the Suicide and Crisis Lifeline: 988 go to [facility] Urgent [facility] [address], [place] [phone]) call 911 if you are experiencing a Mental Health or Behavioral Health Crisis or need someone to talk to.  Rolin Kerns, LCSW [place]  [facility], [facility] Clinical Social Worker Direct Dial: [phone]  Fax: [phone] Website: [email or web address] 2:14 PM

## 2023-10-15 NOTE — Patient Outreach (Signed)
 LCSW introduced self to patient and explained role within Complex Care Management. Patient endorsed that her spouse has abandoned her and their adult daughter, who has an IDD condition. Pt's rent has not been paid and she is requesting assistance with community resources. RNCM and BSW are involved in patient's care.   LCSW informed pt of eligibility of support services provided through [facility] to assist pt and daughter with strengthening support. Pt provided LCSW with a contact number for a medicaid representative at [phone], # was provided to BSW to verify pt has been assigned to [facility] CM.   Pt denies SI/HI. She participates in behavioral health services through [facility] noting last appt scheduled was 8/28 with Tinnie Garret

## 2023-10-19 ENCOUNTER — Telehealth: Payer: MEDICAID

## 2023-10-20 ENCOUNTER — Telehealth: Payer: Self-pay

## 2023-10-20 NOTE — Progress Notes (Signed)
 Complex Care Management Note Care Guide Note  10/20/2023 Name: Alyssa Price MRN: [id number] DOB: May 19, 1980   Complex Care Management Outreach Attempts: An unsuccessful telephone outreach was attempted today to offer the patient information about available complex care management services.  Follow Up Plan:  Additional outreach attempts will be made to offer the patient complex care management information and services.   Encounter Outcome:  Patient Request to Call Back- Message was sen to LCSW- Rolin Leotis Cloria Davene Salome Romell Ralph [place], [facility] Guide  Direct Dial: [phone]  Fax [phone]

## 2023-10-21 ENCOUNTER — Telehealth: Payer: Self-pay | Admitting: Licensed Clinical Social Worker

## 2023-10-21 ENCOUNTER — Encounter: Payer: Self-pay | Admitting: Licensed Clinical Social Worker

## 2023-10-21 NOTE — Patient Outreach (Signed)
 10/21/2023  SW called the patient and explained to her that the [facility] department ahas provided all the resources and can't not provide anything else. The SW also offered her the shelter information and the patient declined, the patient stated that her sister-in-law was assisting her and the SW asked if she could go stay there and patient's response was I can't go there, if I could I would have been there by now.   The patient stated that she went to [employer] homes today but could not figure out where the office was and the building she thought the door was locked. The SW provided her the contact information for [employer].  The SW explained again that all the resources have been provided to her and the patient stated that she will continue to call the [facility] department because we can just give her a resource everyday, she does not have any one else to call.  Tobias CHARM Maranda HEDWIG, PhD [facility], [facility] Social Worker Direct Dial: [phone]  Fax: [phone]

## 2023-10-21 NOTE — Patient Outreach (Signed)
 Complex Care Management   Visit Note  10/21/2023  Name:  Alyssa Price MRN: [id number] DOB: [birth year]  Situation: Referral received for Complex Care Management related to SDOH Barriers:  Housing homelessness I obtained verbal consent from Patient.  Visit completed with Patient  on the phone  Background:   Past Medical History:  Diagnosis Date   Diabetes mellitus without complication (HCC)    Hypertension     Assessment: Patient declined resources to local shelters and declined going back to [employer] with her family he refused shelter information ( she stated that has a lot of people and those people might kill her, she is still at the house sleeping in the truck. She refused to go to where her family is in [facility] ([place], [place]). she stated that her mom and dad don't have room for her and her brother is living with her parents and she stated that she does not have any friends.  SW explained that she has provided her with all the resources that she can give her ., SW also provided her the number to [employer] for member crisis [phone]. And also encouraged the patient to call 211    Recommendation:   none  Follow Up Plan:   Closing From:  Complex Care Management  Tobias CHARM Maranda HEDWIG, PhD [facility], [facility] Social Worker Direct Dial: [phone]  Fax: [phone]

## 2023-10-22 ENCOUNTER — Telehealth: Payer: Self-pay

## 2023-10-22 NOTE — Progress Notes (Signed)
 Complex Care Management Note  Care Guide Note 10/22/2023 Name: Alyssa Price MRN: [id number] DOB: 1980/10/28  Alyssa Price is a 43 y.o. year old female who sees Petrina Pries, NP for primary care. I reached out to Garen JULIANNA Pouch by phone today to offer complex care management services.  Alyssa Price was given information about Complex Care Management services today including:   The Complex Care Management services include support from the care team which includes your Nurse Care Manager, Clinical Social Worker, or Pharmacist.  The Complex Care Management team is here to help remove barriers to the health concerns and goals most important to you. Complex Care Management services are voluntary, and the patient may decline or stop services at any time by request to their care team member.   Complex Care Management Consent Status: Patient agreed to services and verbal consent obtained.   Follow up plan:  10/27/23  Encounter Outcome:  Patient Scheduled  Leotis Rase [facility], [facility] Guide  Direct Dial: [phone]  Fax [phone]

## 2023-10-23 ENCOUNTER — Telehealth: Payer: Self-pay

## 2023-10-23 NOTE — Progress Notes (Signed)
   Telephone encounter was:  Unsuccessful.  10/23/2023 Name: SHAUNESSY DOBRATZ MRN: [id number] DOB: 03/11/1980  Unsuccessful outbound call made today to assist with:  Transportation Needs , Food Insecurity, and Financial Difficulties related to financial strain  Outreach Attempt:  1st Attempt  No answer and unable to leave a message    Jon Colt [facility]  Amb[facility] Center Guide, Phone: [phone] Fax: [phone] Website: [email or web address]

## 2023-10-27 ENCOUNTER — Telehealth: Payer: Self-pay

## 2023-10-27 ENCOUNTER — Telehealth: Payer: MEDICAID | Admitting: Licensed Clinical Social Worker

## 2023-10-27 NOTE — Progress Notes (Signed)
   Telephone encounter was:  Unsuccessful.  10/27/2023 Name: LAMEKA DISLA MRN: [id number] DOB: [birth year]  Unsuccessful outbound call made today to assist with:  Transportation Needs , Food Insecurity, and Financial Difficulties related to financial strain  Outreach Attempt:  2nd Attempt  A HIPAA compliant voice message was left requesting a return call.  Instructed patient to call back    Jon Colt [facility] Guide, Phone: [phone] Fax: [phone] Website: [email or web address]

## 2023-10-30 ENCOUNTER — Telehealth: Payer: Self-pay | Admitting: Licensed Clinical Social Worker

## 2023-10-30 ENCOUNTER — Telehealth: Payer: Self-pay

## 2023-10-30 NOTE — Progress Notes (Signed)
   Telephone encounter was:  Successful.  Complex Care Management Note Care Guide Note  10/30/2023 Name: Alyssa Price MRN: [id number] DOB: 1980/04/18  Alyssa Price is a 43 y.o. year old female who is a primary care patient of Petrina Pries, NP . The community resource team was consulted for assistance with Transportation Needs , Food Insecurity, and Financial Difficulties related to financial strain  SDOH screenings and interventions completed:  No        Care guide performed the following interventions: Patient provided with information about care guide support team and interviewed to confirm resource needs.Pt refused resources   Follow Up Plan:  No further follow up planned at this time. The patient has been provided with needed resources.  Encounter Outcome:  Patient Refused    Jon Colt [facility]  [facility] Guide, Phone: [phone] Fax: [phone] Website: [email or web address]

## 2023-11-11 ENCOUNTER — Telehealth: Payer: MEDICAID

## 2024-01-28 ENCOUNTER — Encounter: Payer: MEDICAID | Admitting: Family Medicine
